# Patient Record
Sex: Male | Born: 1935 | Race: White | Hispanic: No | Marital: Single | Smoking: Former smoker
Health system: Southern US, Community
[De-identification: ages and names within clinical notes are randomized; demographics above are authoritative.]

## PROBLEM LIST (undated history)

## (undated) DIAGNOSIS — I519 Heart disease, unspecified: Secondary | ICD-10-CM

## (undated) DIAGNOSIS — N289 Disorder of kidney and ureter, unspecified: Secondary | ICD-10-CM

## (undated) DIAGNOSIS — J45909 Unspecified asthma, uncomplicated: Secondary | ICD-10-CM

## (undated) DIAGNOSIS — F0390 Unspecified dementia without behavioral disturbance: Secondary | ICD-10-CM

## (undated) DIAGNOSIS — F79 Unspecified intellectual disabilities: Secondary | ICD-10-CM

## (undated) DIAGNOSIS — I509 Heart failure, unspecified: Secondary | ICD-10-CM

## (undated) DIAGNOSIS — F419 Anxiety disorder, unspecified: Secondary | ICD-10-CM

## (undated) DIAGNOSIS — I219 Acute myocardial infarction, unspecified: Secondary | ICD-10-CM

## (undated) DIAGNOSIS — I10 Essential (primary) hypertension: Secondary | ICD-10-CM

## (undated) DIAGNOSIS — K219 Gastro-esophageal reflux disease without esophagitis: Secondary | ICD-10-CM

## (undated) DIAGNOSIS — I4891 Unspecified atrial fibrillation: Secondary | ICD-10-CM

## (undated) DIAGNOSIS — E785 Hyperlipidemia, unspecified: Secondary | ICD-10-CM

## (undated) DIAGNOSIS — E119 Type 2 diabetes mellitus without complications: Secondary | ICD-10-CM

## (undated) DIAGNOSIS — N400 Enlarged prostate without lower urinary tract symptoms: Secondary | ICD-10-CM

## (undated) HISTORY — DX: Hyperlipidemia, unspecified: E78.5

## (undated) HISTORY — DX: Benign prostatic hyperplasia without lower urinary tract symptoms: N40.0

## (undated) HISTORY — DX: Gastro-esophageal reflux disease without esophagitis: K21.9

## (undated) HISTORY — DX: Essential (primary) hypertension: I10

## (undated) HISTORY — DX: Heart disease, unspecified: I51.9

## (undated) HISTORY — DX: Acute myocardial infarction, unspecified: I21.9

## (undated) HISTORY — DX: Unspecified atrial fibrillation: I48.91

## (undated) HISTORY — DX: Unspecified asthma, uncomplicated: J45.909

## (undated) HISTORY — DX: Disorder of kidney and ureter, unspecified: N28.9

## (undated) HISTORY — DX: Anxiety disorder, unspecified: F41.9

---

## 1988-11-24 HISTORY — PX: HERNIA REPAIR: SHX51

## 2004-09-04 ENCOUNTER — Ambulatory Visit: Payer: Self-pay

## 2005-05-23 ENCOUNTER — Ambulatory Visit: Payer: Self-pay

## 2005-09-30 ENCOUNTER — Ambulatory Visit: Payer: Self-pay | Admitting: Family Medicine

## 2007-10-14 ENCOUNTER — Ambulatory Visit: Payer: Self-pay | Admitting: Gastroenterology

## 2007-10-21 ENCOUNTER — Emergency Department: Payer: Self-pay | Admitting: Emergency Medicine

## 2009-10-12 ENCOUNTER — Ambulatory Visit: Payer: Self-pay | Admitting: Internal Medicine

## 2011-10-13 ENCOUNTER — Inpatient Hospital Stay: Payer: Self-pay | Admitting: Internal Medicine

## 2013-03-22 ENCOUNTER — Ambulatory Visit: Payer: Self-pay | Admitting: Gastroenterology

## 2013-03-23 LAB — PATHOLOGY REPORT

## 2013-06-28 ENCOUNTER — Ambulatory Visit: Payer: Self-pay | Admitting: Specialist

## 2014-01-13 ENCOUNTER — Emergency Department: Payer: Self-pay | Admitting: Emergency Medicine

## 2014-01-13 LAB — URINALYSIS, COMPLETE
BLOOD: NEGATIVE
Bilirubin,UR: NEGATIVE
Glucose,UR: NEGATIVE mg/dL (ref 0–75)
Ketone: NEGATIVE
Leukocyte Esterase: NEGATIVE
Nitrite: NEGATIVE
Ph: 5 (ref 4.5–8.0)
Protein: NEGATIVE
Specific Gravity: 1.017 (ref 1.003–1.030)
WBC UR: 3 /HPF (ref 0–5)

## 2014-01-13 LAB — CBC
HCT: 41.7 % (ref 40.0–52.0)
HGB: 14.2 g/dL (ref 13.0–18.0)
MCH: 30.7 pg (ref 26.0–34.0)
MCHC: 34 g/dL (ref 32.0–36.0)
MCV: 90 fL (ref 80–100)
PLATELETS: 211 10*3/uL (ref 150–440)
RBC: 4.62 10*6/uL (ref 4.40–5.90)
RDW: 14.7 % — ABNORMAL HIGH (ref 11.5–14.5)
WBC: 11.4 10*3/uL — ABNORMAL HIGH (ref 3.8–10.6)

## 2014-01-13 LAB — COMPREHENSIVE METABOLIC PANEL
ALK PHOS: 96 U/L
Albumin: 3.9 g/dL (ref 3.4–5.0)
Anion Gap: 7 (ref 7–16)
BILIRUBIN TOTAL: 0.7 mg/dL (ref 0.2–1.0)
BUN: 23 mg/dL — AB (ref 7–18)
CHLORIDE: 102 mmol/L (ref 98–107)
Calcium, Total: 9.4 mg/dL (ref 8.5–10.1)
Co2: 26 mmol/L (ref 21–32)
Creatinine: 1.12 mg/dL (ref 0.60–1.30)
EGFR (African American): >60
EGFR (Non-African Amer.): >60
Glucose: 81 mg/dL (ref 65–99)
OSMOLALITY: 273 (ref 275–301)
POTASSIUM: 4.3 mmol/L (ref 3.5–5.1)
SGOT(AST): 38 U/L — ABNORMAL HIGH (ref 15–37)
SGPT (ALT): 25 U/L (ref 12–78)
Sodium: 135 mmol/L — ABNORMAL LOW (ref 136–145)
TOTAL PROTEIN: 8.1 g/dL (ref 6.4–8.2)

## 2014-01-13 LAB — TROPONIN I

## 2014-01-13 LAB — LIPASE, BLOOD: LIPASE: 111 U/L (ref 73–393)

## 2014-07-28 DIAGNOSIS — J449 Chronic obstructive pulmonary disease, unspecified: Secondary | ICD-10-CM | POA: Insufficient documentation

## 2015-03-18 NOTE — Consult Note (Signed)
PATIENT NAME:  Ralph Boyd, Ralph Boyd MR#:  [id number] DATE OF BIRTH:  1936-08-08  DATE OF CONSULTATION:  10/14/2011  REFERRING PHYSICIAN:  Dr. Thana Ates as well as [facility] with Dr. Camillo Flaming CONSULTING PHYSICIAN:  Dwayne D. Callwood, MD  INDICATION: Chest pain and angina.  HISTORY OF PRESENT ILLNESS: Ralph Boyd is a 79 year old mentally challenged male with history of hypertension, hyperlipidemia, diabetes, chronic obstructive pulmonary disease, former smoker, possible seizure disorder who is at a family care home and started having recurrent chest pain recently on multiple occasions. Was finally brought to the Emergency Room and subsequently was advised to be admitted for further evaluation and care. He was referred for a Myoview which was abnormal so cardiology consultation was then recommended for further evaluation and management.   PAST SURGICAL HISTORY:  1. Neck surgery. 2. Colonoscopy for polyps.   PAST MEDICAL HISTORY:  1. Hypertension. 2. Hyperlipidemia. 3. Diabetes. 4. Chronic obstructive pulmonary disease. 5. Former smoker. 6. Mental retardation.  7. Seizure disorder. 8. Behavioral disorder.  ALLERGIES: None.   MEDICATIONS:  1. Albuterol 4 mg 2 tablets twice a day. 2. Amitriptyline 25 mg at bedtime.  3. Eyedrops t.i.d.  4. Citalopram 20 mg a day.  5. Finasteride 5 mg daily. 6. HCTZ 12.5 in the morning. 7. Ativan 0.5 mg q.8 hours. 8. Metformin 25 mg a day.  9. Pravastatin 40 a day.  10. Quinapril 20 mg a day.  11. Zyprexa 5 mg a day.   FAMILY HISTORY: Reportedly was negative.   SOCIAL HISTORY: Smoker but quit three months ago. No illicit drug use. Resident of a family care home.  REVIEW OF SYSTEMS: Difficult to assess but he has had some chest pain, some shortness of breath. No blackout spells, syncope. No nausea or vomiting. No fever. No chills. No sweats. No weight loss or weight gain. No hemoptysis, hematemesis. No bright red blood per rectum.    PHYSICAL EXAMINATION:  VITAL SIGNS: Blood pressure 150/102, pulse 97, respiratory rate 20, afebrile.   HEENT: Normocephalic, atraumatic. Pupils reactive to light.   NECK: Supple. No jugular venous distention, bruits, adenopathy.   LUNGS: Clear to auscultation and percussion, mild significant rhonchi, no rales or wheezing.   HEART: Regular rate and rhythm. Positive S3. Soft S4. PMI slightly displaced laterally.   ABDOMEN: Benign.   EXTREMITIES: Within normal limits with slightly decreased pulses.   NEUROLOGIC: Intact.   SKIN: Normal.   LABORATORY, DIAGNOSTIC, AND RADIOLOGICAL DATA: BUN 24, otherwise chemistries are normal. Lipase 207, troponin 0.04. CBC normal. Chest x-ray cardiomegaly, otherwise normal. EKG normal sinus rhythm, left bundle branch block, otherwise nonspecific findings.   ASSESSMENT:  1. Angina.  2. Unstable angina.  3. Chest pain. 4. Cardiomyopathy. 5. Shortness of breath. 6. Diabetes. 7. Hypertension. 8. Hyperlipidemia.  9. Mental retardation.  10. History of seizures.   PLAN: Agree with admit, rule out for myocardial infarction. Agree with functional study, which turned out to be abnormal. Would then recommend cardiac catheterization as I am concerned his chest pain is probably coronary artery disease with an abnormal Myoview and abnormal EKG. Further evaluation I think is clearly indicated. Echocardiogram will be helpful. Will also consider medical therapy. Bypass surgery is probably out of the question because rehab would be next to impossible. Will probably treat the patient medically for now with nitrates, aspirin. Consider adding Plavix, beta blocker, ACE inhibitor provided his blood pressure tolerates and see if we can help with his anginal symptoms. We may consider angioplasty and stenting if he  does not tolerate medications and continues to have recurrent chest pain. Would base further evaluation on results.   ____________________________ Bobbie Stackwayne D.  Juliann Paresallwood, MD ddc:cms D: 10/15/2011 13:59:00 ET T: 10/15/2011 14:27:06 ET JOB#: [id number]  cc: Dwayne D. Juliann Paresallwood, MD, <Dictator> Alwyn PeaWAYNE D CALLWOOD MD ELECTRONICALLY SIGNED 11/28/2011 13:59

## 2015-03-22 ENCOUNTER — Ambulatory Visit
Admit: 2015-03-22 | Disposition: A | Discharge status: Home / Self Care | Payer: Self-pay | Attending: Gastroenterology | Admitting: Gastroenterology

## 2015-07-03 ENCOUNTER — Ambulatory Visit (INDEPENDENT_AMBULATORY_CARE_PROVIDER_SITE_OTHER): Payer: Medicare Other | Admitting: Urology

## 2015-07-03 VITALS — BP 150/68 | HR 68 | Ht 62.0 in | Wt 191.1 lb

## 2015-07-03 DIAGNOSIS — R350 Frequency of micturition: Secondary | ICD-10-CM | POA: Diagnosis not present

## 2015-07-03 LAB — URINALYSIS, COMPLETE
Bilirubin, UA: NEGATIVE
GLUCOSE, UA: NEGATIVE
Ketones, UA: NEGATIVE
NITRITE UA: NEGATIVE
PH UA: 7 (ref 5.0–7.5)
Protein, UA: NEGATIVE
RBC, UA: NEGATIVE
SPEC GRAV UA: 1.01 (ref 1.005–1.030)
Urobilinogen, Ur: 0.2 mg/dL (ref 0.2–1.0)

## 2015-07-03 LAB — MICROSCOPIC EXAMINATION
Bacteria, UA: NONE SEEN
Epithelial Cells (non renal): NONE SEEN /hpf (ref 0–10)
RBC MICROSCOPIC, UA: NONE SEEN /HPF (ref 0–?)
WBC UA: NONE SEEN /HPF (ref 0–?)

## 2015-07-03 LAB — BLADDER SCAN AMB NON-IMAGING: SCAN RESULT: 24

## 2015-07-03 MED ORDER — TAMSULOSIN HCL 0.4 MG PO CAPS: 0.4000 mg | ORAL_CAPSULE | Freq: Every day | ORAL | Status: DC

## 2015-07-03 NOTE — Progress Notes (Signed)
07/03/2015 3:09 PM   Quillian Quince [facility] 04-Jul-1936 [id number]  Referring provider: Jerl Mina, MD [address] [place], [place] [address]  Chief Complaint  Patient presents with  . Urinary Frequency    referred by Dr. Naaman Plummer  . Urinary Incontinence    urge    HPI: A 79 year old male who presents today referred by Dr. Jerl Mina for evaluation and management of worsening urinary incontinence progressive lower urinary tract symptoms. The patient has a history of BPH and overactive bladder for which she is been treated with myrbetriq 25 mg daily and finasteride 5 mg daily. His symptoms at the 3 stable until 6 months ago. At this point, the patient is now wearing depends diapers because of his incontinence. He goes through approximately 1 diaper per day. The patient is mentally disabled and as such is unable to participate in the interview. His caregiver does state that the patient does not have urinary frequency, voids AB 6 or 7 times in the day, and does not get up typically at night. The patient has no history of hematuria or dysuria. He has no history of urinary tract infections. He has no history of kidney stones. The patient does not drink a significant amount of fluid.     PMH: Past Medical History  Diagnosis Date  . Acid reflux   . Anxiety   . Asthma   . MI (myocardial infarction)   . Atrial fibrillation   . Heart disease   . HLD (hyperlipidemia)   . HTN (hypertension)   . Kidney disease   . BPH (benign prostatic hyperplasia)     Surgical History: No past surgical history on file.  Home Medications:    Medication List       This list is accurate as of: 07/03/15  3:09 PM.  Always use your most recent med list.               ADVAIR HFA 45-21 MCG/ACT inhaler  Generic drug:  fluticasone-salmeterol  INHALE 2 PUFFS BY MOUTH TWICE DAILY. **RINSE MOUTH AFTER EACH USE**     AMITIZA 24 MCG capsule  Generic drug:  lubiprostone  Take by  mouth.     amitriptyline 50 MG tablet  Commonly known as:  ELAVIL  TAKE 1/2 TABLET BY MOUTH AT BEDTIME.     amLODipine 5 MG tablet  Commonly known as:  NORVASC  TAKE 1 TABLET BY MOUTH TWICE DAILY.     aspirin 81 MG chewable tablet  CHEW AND SWALLOW ONE TABLET BY MOUTH ONCE DAILY.     citalopram 20 MG tablet  Commonly known as:  CELEXA  TAKE 1 TABLET BY MOUTH ONCE DAILY.     clopidogrel 75 MG tablet  Commonly known as:  PLAVIX  TAKE 1 TABLET BY MOUTH ONCE DAILY.     finasteride 5 MG tablet  Commonly known as:  PROSCAR  TAKE 1 TABLET BY MOUTH DAILY.     isosorbide mononitrate 60 MG 24 hr tablet  Commonly known as:  IMDUR  TAKE 1 TABLET BY MOUTH TWICE DAILY.     LORazepam 0.5 MG tablet  Commonly known as:  ATIVAN  Take by mouth.     metoprolol tartrate 25 MG tablet  Commonly known as:  LOPRESSOR  Take by mouth.     MINERIN Crea  APPLY 1 APPLICATION TO DRY SKIN DAILY.     MYRBETRIQ 25 MG Tb24 tablet  Generic drug:  mirabegron ER  TAKE 1 TABLET  BY MOUTH ONCE DAILY.     NEXIUM 40 MG capsule  Generic drug:  esomeprazole  TAKE 1 CAPSULE BY MOUTH DAILY.     nitroGLYCERIN 0.4 MG SL tablet  Commonly known as:  NITROSTAT  Place one tablet under tongue as needed.     OLANZapine 5 MG tablet  Commonly known as:  ZYPREXA  TAKE 1 TABLET BY MOUTH ONCE DAILY.     pravastatin 40 MG tablet  Commonly known as:  PRAVACHOL  TAKE 1 TABLET BY MOUTH AT BEDTIME.     PROAIR HFA 108 (90 BASE) MCG/ACT inhaler  Generic drug:  albuterol  Inhale into the lungs.     quinapril 20 MG tablet  Commonly known as:  ACCUPRIL  TAKE 1/2 TABLET (=10MG ) BY MOUTH EVERY MORNING.     tamsulosin 0.4 MG Caps capsule  Commonly known as:  FLOMAX  Take 1 capsule (0.4 mg total) by mouth daily.        Allergies: No Known Allergies  Family History: No family history on file.  Social History:  reports that he has quit smoking. He does not have any smokeless tobacco history on file. He reports  that he does not drink alcohol or use illicit drugs.  ROS: 12-point review of system was performed and is negative unless otherwise stated in the HPI with the following additions.  Physical Exam: BP 150/68 mmHg  Pulse 68  Ht  (1.575 m)  Wt 191 lb 1.6 oz (86.682 kg)  BMI 34.94 kg/m2  Constitutional:  Alert and oriented, No acute distress. HEENT: [place] AT, moist mucus membranes.  Trachea midline, no masses. Cardiovascular: No clubbing, cyanosis, or edema. Respiratory: Normal respiratory effort, no increased work of breathing. GI: Abdomen is soft, nontender, nondistended, no abdominal masses GU: No CVA tenderness. The patient's rectal exam demonstrates nodularity at the base, right greater than left. Prostate size is 30-40 g. Skin: No rashes, bruises or suspicious lesions. Lymph: No cervical or inguinal adenopathy. Neurologic: Grossly intact, no focal deficits, moving all 4 extremities. Psychiatric: Normal mood and affect.  Laboratory Data: Lab Results  Component Value Date   WBC 11.4* 01/13/2014   HGB 14.2 01/13/2014   HCT 41.7 01/13/2014   MCV 90 01/13/2014   PLT 211 01/13/2014    Lab Results  Component Value Date   CREATININE 1.12 01/13/2014    No results found for: PSA  No results found for: TESTOSTERONE  No results found for: HGBA1C  Urinalysis    Component Value Date/Time   COLORURINE Yellow 01/13/2014 1010   APPEARANCEUR Hazy 01/13/2014 1010   LABSPEC 1.017 01/13/2014 1010   PHURINE 5.0 01/13/2014 1010   GLUCOSEU Negative 01/13/2014 1010   HGBUR Negative 01/13/2014 1010   BILIRUBINUR Negative 01/13/2014 1010   KETONESUR Negative 01/13/2014 1010   PROTEINUR Negative 01/13/2014 1010   NITRITE Negative 01/13/2014 1010   LEUKOCYTESUR Negative 01/13/2014 1010    Pertinent Imaging: PVR: 23 mL  Assessment & Plan:  The patient has progressive urinary incontinence. The etiology of this remains unclear, his was void residual was low and his urinalysis today  clear. He does have a concerning gentle rectal exam. Our plan is to add Flomax to the patient's regimen for his BPH. I also increased his myrbetriq to 50 mg daily. I also sent the patient to the lab to have a PSA drawn today. The patient will follow-up in one month to reevaluate his progress. If his PSA is elevated, we will likely recheck at that point.   -  BLADDER SCAN AMB NON-IMAGING - Urinalysis, Complete - PSA - tamsulosin (FLOMAX) 0.4 MG CAPS capsule; Take 1 capsule (0.4 mg total) by mouth daily.  Dispense: 30 capsule; Refill: 0   Return in about 1 month (around 08/03/2015) for urinary frequency.  Crist Fat, MD  [facility] [address] [place], [place] [address] [phone]

## 2015-07-04 LAB — PSA: Prostate Specific Ag, Serum: 1.5 ng/mL (ref 0.0–4.0)

## 2015-07-13 DIAGNOSIS — F79 Unspecified intellectual disabilities: Secondary | ICD-10-CM | POA: Insufficient documentation

## 2015-07-27 ENCOUNTER — Encounter: Payer: Self-pay | Admitting: *Deleted

## 2015-07-27 DIAGNOSIS — I2510 Atherosclerotic heart disease of native coronary artery without angina pectoris: Secondary | ICD-10-CM | POA: Insufficient documentation

## 2015-07-27 DIAGNOSIS — E7800 Pure hypercholesterolemia, unspecified: Secondary | ICD-10-CM | POA: Insufficient documentation

## 2015-07-27 DIAGNOSIS — I10 Essential (primary) hypertension: Secondary | ICD-10-CM | POA: Insufficient documentation

## 2015-07-27 DIAGNOSIS — E119 Type 2 diabetes mellitus without complications: Secondary | ICD-10-CM | POA: Insufficient documentation

## 2015-07-31 ENCOUNTER — Ambulatory Visit (INDEPENDENT_AMBULATORY_CARE_PROVIDER_SITE_OTHER): Payer: Medicare Other | Admitting: Urology

## 2015-07-31 VITALS — BP 137/64 | HR 48 | Wt 187.5 lb

## 2015-07-31 DIAGNOSIS — R350 Frequency of micturition: Secondary | ICD-10-CM | POA: Diagnosis not present

## 2015-07-31 LAB — BLADDER SCAN AMB NON-IMAGING

## 2015-07-31 NOTE — Progress Notes (Signed)
07/31/2015 10:09 AM   Ralph Boyd 22-Feb-1936 [id number]  Referring provider: Jerl Mina, MD [address] [place], [place] [address]  Chief Complaint  Patient presents with  . Urinary Frequency    21month follow up    HPI:  1 - Lower Urinary Tract Symptoms / Incontinence without Awareness - long h/o what sounds like mix of irritative and obstructive LUTS. Severe mental disability and incontinent at times and unable to communicate when needs to void. In diapers at al times 2016 Cr 1.12, PVR 24 mL, PSA 1.5, UA without sig hematuria.  Currently on finasteride5  + myrbetriq 50+ tamsulosin 0.4 and caregivers report some incremental improvement, overall satisfied.    2 - Phimosis - impressive phimosis with inability to rectract foreskin noted on exam 2016. No balooning with voids and no problems with hygeine. No prior circumcision.   Today Ralph Boyd is seen in f/u above. Caaregivers report some incremental improvement in GU funciton. No hematuria.   PMH: Past Medical History  Diagnosis Date  . Acid reflux   . Anxiety   . Asthma   . MI (myocardial infarction)   . Atrial fibrillation   . Heart disease   . HLD (hyperlipidemia)   . HTN (hypertension)   . Kidney disease   . BPH (benign prostatic hyperplasia)     Surgical History: No past surgical history on file.  Home Medications:    Medication List       This list is accurate as of: 07/31/15 10:09 AM.  Always use your most recent med list.               ADVAIR HFA 45-21 MCG/ACT inhaler  Generic drug:  fluticasone-salmeterol  INHALE 2 PUFFS BY MOUTH TWICE DAILY. **RINSE MOUTH AFTER EACH USE**     AMITIZA 24 MCG capsule  Generic drug:  lubiprostone  Take by mouth.     amitriptyline 50 MG tablet  Commonly known as:  ELAVIL  TAKE 1/2 TABLET BY MOUTH AT BEDTIME.     amLODipine 5 MG tablet  Commonly known as:  NORVASC  TAKE 1 TABLET BY MOUTH TWICE DAILY.     aspirin 81 MG chewable tablet  CHEW  AND SWALLOW ONE TABLET BY MOUTH ONCE DAILY.     citalopram 20 MG tablet  Commonly known as:  CELEXA  TAKE 1 TABLET BY MOUTH ONCE DAILY.     clopidogrel 75 MG tablet  Commonly known as:  PLAVIX  TAKE 1 TABLET BY MOUTH ONCE DAILY.     finasteride 5 MG tablet  Commonly known as:  PROSCAR  TAKE 1 TABLET BY MOUTH DAILY.     isosorbide mononitrate 60 MG 24 hr tablet  Commonly known as:  IMDUR  TAKE 1 TABLET BY MOUTH TWICE DAILY.     LORazepam 0.5 MG tablet  Commonly known as:  ATIVAN  Take by mouth.     metoprolol succinate 25 MG 24 hr tablet  Commonly known as:  TOPROL-XL  Take 25 mg by mouth daily.     metoprolol tartrate 25 MG tablet  Commonly known as:  LOPRESSOR  Take by mouth.     MINERIN Crea  APPLY 1 APPLICATION TO DRY SKIN DAILY.     MYRBETRIQ 50 MG Tb24 tablet  Generic drug:  mirabegron ER  Take 50 mg by mouth daily.     NEXIUM 40 MG capsule  Generic drug:  esomeprazole  TAKE 1 CAPSULE BY MOUTH DAILY.     nitroGLYCERIN  0.4 MG SL tablet  Commonly known as:  NITROSTAT  Place one tablet under tongue as needed.     pravastatin 40 MG tablet  Commonly known as:  PRAVACHOL  TAKE 1 TABLET BY MOUTH AT BEDTIME.     PROAIR HFA 108 (90 BASE) MCG/ACT inhaler  Generic drug:  albuterol  Inhale into the lungs.     quinapril 20 MG tablet  Commonly known as:  ACCUPRIL  TAKE 1/2 TABLET (=10MG ) BY MOUTH EVERY MORNING.     tamsulosin 0.4 MG Caps capsule  Commonly known as:  FLOMAX  Take 1 capsule (0.4 mg total) by mouth daily.        Allergies: No Known Allergies  Family History: No family history on file.  Social History:  reports that he has quit smoking. He does not have any smokeless tobacco history on file. He reports that he does not drink alcohol or use illicit drugs.  ROS:   Urological Symptom Review   Review of Systems  Gastrointestinal (upper)  : Negative for upper GI symptoms  Gastrointestinal (lower) : Negative for lower GI  symptoms  Constitutional : Negative for symptoms  Skin: Negative for skin symptoms  Eyes: Negative for eye symptoms  Ear/Nose/Throat : Negative for Ear/Nose/Throat symptoms  Hematologic/Lymphatic: Negative for Hematologic/Lymphatic symptoms  Cardiovascular : Negative for cardiovascular symptoms  Respiratory : Negative for respiratory symptoms  Endocrine: Negative for endocrine symptoms  Musculoskeletal: Negative for musculoskeletal symptoms  Neurological: Negative for neurological symptoms  Psychologic: Negative for psychiatric symptoms At basleie level of cognitive funtion per caregivers.                                      Physical Exam: BP 137/64 mmHg  Pulse 48  Wt 187 lb 8 oz (85.049 kg)  Constitutional:  Alert and oriented, No acute distress. HEENT: Ralph Boyd AT, moist mucus membranes.  Trachea midline, no masses. Cardiovascular: No clubbing, cyanosis, or edema. Respiratory: Normal respiratory effort, no increased work of breathing. GI: Abdomen is soft, nontender, nondistended, no abdominal masses GU: No CVA tenderness. Mod-severe phimosis, unable to visualize glans, no discharge / erythema. Skin: No rashes, bruises or suspicious lesions. Lymph: No cervical or inguinal adenopathy. Neurologic: Grossly intact, no focal deficits, moving all 4 extremities. Psychiatric: Blunted affect. Cooperative but AO x 0.   Laboratory Data: Lab Results  Component Value Date   WBC 11.4* 01/13/2014   HGB 14.2 01/13/2014   HCT 41.7 01/13/2014   MCV 90 01/13/2014   PLT 211 01/13/2014    Lab Results  Component Value Date   CREATININE 1.12 01/13/2014    Lab Results  Component Value Date   PSA 1.5 07/03/2015    No results found for: TESTOSTERONE  No results found for: HGBA1C  Urinalysis    Component Value Date/Time   COLORURINE Yellow 01/13/2014 1010   APPEARANCEUR Hazy 01/13/2014 1010   LABSPEC 1.017 01/13/2014 1010   PHURINE 5.0  01/13/2014 1010   GLUCOSEU Negative 07/03/2015 1411   GLUCOSEU Negative 01/13/2014 1010   HGBUR Negative 01/13/2014 1010   BILIRUBINUR Negative 07/03/2015 1411   BILIRUBINUR Negative 01/13/2014 1010   KETONESUR Negative 01/13/2014 1010   PROTEINUR Negative 01/13/2014 1010   NITRITE Negative 07/03/2015 1411   NITRITE Negative 01/13/2014 1010   LEUKOCYTESUR Trace* 07/03/2015 1411   LEUKOCYTESUR Negative 01/13/2014 1010    Pertinent Imaging:   Assessment & Plan:  1 - Lower Urinary Tract Symptoms / Incontinence without Awareness - on maximal medical therapy for presumed BPH and OAB compnents, he is not in retention. Discussed goals of management with caregiveer and they understand that soe element is clearly driven by his inability to communicate. I feel there is no role for additional GU meds, but do think continuing current regimen reaosnable as his caregiveers state he is improved on therapy.   2 - Phimosis - no funcitonal sequalae, does make voided  urine samples completely unreliable. Observe.  3 - RTC 1 year with NP for med refill, exam.    No Follow-up on file.  Sebastian Ache, MD  [facility] [address], [address] [place], [place] [address] [phone]

## 2015-08-03 ENCOUNTER — Other Ambulatory Visit: Payer: Self-pay | Admitting: Family Medicine

## 2015-08-03 DIAGNOSIS — R9389 Abnormal findings on diagnostic imaging of other specified body structures: Secondary | ICD-10-CM

## 2015-08-09 ENCOUNTER — Ambulatory Visit
Admission: RE | Admit: 2015-08-09 | Discharge: 2015-08-09 | Disposition: A | Discharge status: Home / Self Care | Payer: Medicare Other | Source: Ambulatory Visit | Attending: Family Medicine | Admitting: Family Medicine

## 2015-08-09 DIAGNOSIS — R938 Abnormal findings on diagnostic imaging of other specified body structures: Secondary | ICD-10-CM | POA: Diagnosis present

## 2015-08-09 DIAGNOSIS — I2510 Atherosclerotic heart disease of native coronary artery without angina pectoris: Secondary | ICD-10-CM | POA: Insufficient documentation

## 2015-08-09 DIAGNOSIS — K449 Diaphragmatic hernia without obstruction or gangrene: Secondary | ICD-10-CM | POA: Diagnosis not present

## 2015-08-09 DIAGNOSIS — R9389 Abnormal findings on diagnostic imaging of other specified body structures: Secondary | ICD-10-CM

## 2015-08-09 MED ORDER — IOHEXOL 300 MG/ML  SOLN
75.0000 mL | Freq: Once | INTRAMUSCULAR | Status: AC | PRN
  Administered 2015-08-09: 75 mL via INTRAVENOUS

## 2016-04-16 ENCOUNTER — Ambulatory Visit (INDEPENDENT_AMBULATORY_CARE_PROVIDER_SITE_OTHER): Payer: Medicare Other | Admitting: Urology

## 2016-04-16 ENCOUNTER — Encounter: Payer: Self-pay | Admitting: Urology

## 2016-04-16 VITALS — BP 105/66 | HR 57 | Ht 62.0 in | Wt 181.3 lb

## 2016-04-16 DIAGNOSIS — R319 Hematuria, unspecified: Secondary | ICD-10-CM | POA: Diagnosis not present

## 2016-04-16 NOTE — Progress Notes (Signed)
04/16/2016 8:44 AM   Ralph Boyd [birth year] [id number]  Referring provider: Jerl Mina, MD [address] [facility] [place], [place] [address]  Chief Complaint  Patient presents with  . Hematuria    patient unsure saw blood in underwear    HPI: Patient is a 80 year old Caucasian male who presents with his sister, Ralph Boyd, for further evaluation after his caregivers stated they saw blood in his underpants.  Patient himself is a poor historian, but he states he has not seen blood in his urine.  His sister states that he had bright red blood in his underpants sometime last week and another incident in January 2017.  He is having frequency of urination, getting up at night to urinate and leakage of urine. He does not complain of dysuria, suprapubic pain, back pain, abdominal pain or flank pain.  He does not have a prior history of nephrolithiasis.  He has not had recent fevers, chills, nausea or vomiting.  He and his sister deny any blood in the stool.    His UA was unremarkable.    PMH: Past Medical History  Diagnosis Date  . Acid reflux   . Anxiety   . Asthma   . MI (myocardial infarction) (HCC)   . Atrial fibrillation (HCC)   . Heart disease   . HLD (hyperlipidemia)   . HTN (hypertension)   . Kidney disease   . BPH (benign prostatic hyperplasia)     Surgical History: Past Surgical History  Procedure Laterality Date  . Hernia repair      Home Medications:    Medication List       This list is accurate as of: 04/16/16 11:59 PM.  Always use your most recent med list.               ADVAIR HFA 45-21 MCG/ACT inhaler  Generic drug:  fluticasone-salmeterol  INHALE 2 PUFFS BY MOUTH TWICE DAILY. **RINSE MOUTH AFTER EACH USE**     AMITIZA 24 MCG capsule  Generic drug:  lubiprostone  Take by mouth. Reported on 04/16/2016     amitriptyline 50 MG tablet  Commonly known as:  ELAVIL  TAKE 1/2 TABLET BY MOUTH AT BEDTIME.     amLODipine 5 MG  tablet  Commonly known as:  NORVASC  TAKE 1 TABLET BY MOUTH TWICE DAILY.     aspirin 81 MG chewable tablet  CHEW AND SWALLOW ONE TABLET BY MOUTH ONCE DAILY.     citalopram 20 MG tablet  Commonly known as:  CELEXA  TAKE 1 TABLET BY MOUTH ONCE DAILY.     clopidogrel 75 MG tablet  Commonly known as:  PLAVIX  TAKE 1 TABLET BY MOUTH ONCE DAILY.     finasteride 5 MG tablet  Commonly known as:  PROSCAR  TAKE 1 TABLET BY MOUTH DAILY.     furosemide 20 MG tablet  Commonly known as:  LASIX  Take by mouth. Reported on 04/16/2016     GAVISCON 95-358 MG/15ML Susp  Generic drug:  aluminum hydroxide-magnesium carbonate  Take by mouth.     isosorbide mononitrate 60 MG 24 hr tablet  Commonly known as:  IMDUR  TAKE 1 TABLET BY MOUTH TWICE DAILY.     lisinopril 5 MG tablet  Commonly known as:  PRINIVIL,ZESTRIL  Take by mouth.     loperamide 2 MG tablet  Commonly known as:  IMODIUM A-D  Take by mouth.     LORazepam 0.5 MG tablet  Commonly known  as:  ATIVAN  Take by mouth. Reported on 04/16/2016     METAMUCIL 0.52 g capsule  Generic drug:  psyllium  Take by mouth. Reported on 04/16/2016     metoprolol succinate 25 MG 24 hr tablet  Commonly known as:  TOPROL-XL  Take 25 mg by mouth daily. Reported on 04/16/2016     metoprolol tartrate 25 MG tablet  Commonly known as:  LOPRESSOR  Take by mouth. Reported on 04/16/2016     MINERIN Crea  Reported on 04/16/2016     MYRBETRIQ 50 MG Tb24 tablet  Generic drug:  mirabegron ER  Take 50 mg by mouth daily. Reported on 04/16/2016     NEXIUM 40 MG capsule  Generic drug:  esomeprazole  TAKE 1 CAPSULE BY MOUTH DAILY.     nitroGLYCERIN 0.4 MG SL tablet  Commonly known as:  NITROSTAT  Place one tablet under tongue as needed.     permethrin 5 % cream  Commonly known as:  ELIMITE  Reported on 04/16/2016     pravastatin 40 MG tablet  Commonly known as:  PRAVACHOL  Reported on 04/16/2016     PROAIR HFA 108 (90 Base) MCG/ACT inhaler  Generic  drug:  albuterol  Inhale into the lungs.     quinapril 20 MG tablet  Commonly known as:  ACCUPRIL  Reported on 04/16/2016     tamsulosin 0.4 MG Caps capsule  Commonly known as:  FLOMAX  Take 1 capsule (0.4 mg total) by mouth daily.        Allergies: No Known Allergies  Family History: No family history on file.  Social History:  reports that he has quit smoking. He does not have any smokeless tobacco history on file. He reports that he does not drink alcohol or use illicit drugs.  ROS: UROLOGY Frequent Urination?: Yes Hard to postpone urination?: No Burning/pain with urination?: No Get up at night to urinate?: Yes Leakage of urine?: Yes Urine stream starts and stops?: No Trouble starting stream?: No Do you have to strain to urinate?: No Blood in urine?: No Urinary tract infection?: No Sexually transmitted disease?: No Injury to kidneys or bladder?: No Painful intercourse?: No Weak stream?: No Erection problems?: No Penile pain?: No  Gastrointestinal Nausea?: No Vomiting?: No Indigestion/heartburn?: No Diarrhea?: No Constipation?: No  Constitutional Fever: No Night sweats?: No Weight loss?: No Fatigue?: No  Skin Skin rash/lesions?: No Itching?: No  Eyes Blurred vision?: No Double vision?: No  Ears/Nose/Throat Sore throat?: No Sinus problems?: No  Hematologic/Lymphatic Swollen glands?: No Easy bruising?: No  Cardiovascular Leg swelling?: No Chest pain?: No  Respiratory Cough?: No Shortness of breath?: Yes  Endocrine Excessive thirst?: Yes  Musculoskeletal Back pain?: No Joint pain?: No  Neurological Headaches?: No Dizziness?: No  Psychologic Depression?: No Anxiety?: No  Physical Exam: BP 105/66 mmHg  Pulse 57  Ht  (1.575 m)  Wt 181 lb 4.8 oz (82.237 kg)  BMI 33.15 kg/m2  Constitutional: Well nourished. Alert and oriented, No acute distress. HEENT: Ralph Boyd AT, moist mucus membranes. Trachea midline, no  masses. Cardiovascular: No clubbing, cyanosis, or edema. Respiratory: Normal respiratory effort, no increased work of breathing. GI: Abdomen is soft, non tender, non distended, no abdominal masses. Liver and spleen not palpable.  No hernias appreciated.  Stool sample for occult testing is not indicated.   GU: No CVA tenderness.  No bladder fullness or masses.  Patient with uncircumcised phallus. Foreskin could not be retracted  Urethral meatus is patent.  No  penile discharge. No penile lesions or rashes. Scrotum without lesions, cysts, rashes and/or edema.  Testicles are located scrotally bilaterally. No masses are appreciated in the testicles. Left and right epididymis are normal. Rectal: Patient with  normal sphincter tone. Anus and perineum without scarring or rashes. No rectal masses are appreciated. Prostate is approximately 50 grams, no nodules are appreciated. Seminal vesicles are normal. Skin: No rashes, bruises or suspicious lesions. Lymph: No cervical or inguinal adenopathy. Neurologic: Grossly intact, no focal deficits, moving all 4 extremities. Psychiatric: Normal mood and affect.  Laboratory Data: Lab Results  Component Value Date   WBC 11.4* 01/13/2014   HGB 14.2 01/13/2014   HCT 41.7 01/13/2014   MCV 90 01/13/2014   PLT 211 01/13/2014    Lab Results  Component Value Date   CREATININE 1.12 01/13/2014    Lab Results  Component Value Date   AST 38* 01/13/2014   Lab Results  Component Value Date   ALT 25 01/13/2014   PSA history  1.5 ng/mL on 07/03/2015  Urinalysis Results for orders placed or performed in visit on 04/16/16  Microscopic Examination  Result Value Ref Range   WBC, UA 6-10 (A) 0 -  5 /hpf   RBC, UA 0-2 0 -  2 /hpf   Epithelial Cells (non renal) 0-10 0 - 10 /hpf   Bacteria, UA Few None seen/Few  Urinalysis, Complete  Result Value Ref Range   Specific Gravity, UA 1.015 1.005 - 1.030   pH, UA 6.0 5.0 - 7.5   Color, UA Yellow Yellow   Appearance  Ur Cloudy (A) Clear   Leukocytes, UA 2+ (A) Negative   Protein, UA Negative Negative/Trace   Glucose, UA Negative Negative   Ketones, UA Negative Negative   RBC, UA Negative Negative   Bilirubin, UA Negative Negative   Urobilinogen, Ur 0.2 0.2 - 1.0 mg/dL   Nitrite, UA Negative Negative   Microscopic Examination See below:     Assessment & Plan:    1. Gross hematuria:   No sign of bleeding on today's exam. His urinalysis was negative for hematuria.  There had not be a witnessed event of gross hematuria in the toilet.  His sister states that sometimes he does scratch his groin area at night.  She does not want the patient to undergo any invasive testing at this time.  Explained to patient the causes of blood in the urine are as follows: stones,  BPH, UTI's, damage to the urinary tract and/or cancer.  She will continue to monitor Mr. Kathrine HaddockWeatherford.  If he should have microscopic hematuria or a witnessed event of gross hematuria, she will contact the office.  - Urinalysis, Complete   Return if symptoms worsen or fail to improve.  These notes generated with voice recognition software. I apologize for typographical errors.  Michiel CowboySHANNON Gracemarie Skeet, PA-C  [facility] [address], [address] [place], [place] [address] [phone]

## 2016-04-17 LAB — URINALYSIS, COMPLETE
Bilirubin, UA: NEGATIVE
GLUCOSE, UA: NEGATIVE
Ketones, UA: NEGATIVE
Nitrite, UA: NEGATIVE
PROTEIN UA: NEGATIVE
RBC, UA: NEGATIVE
Specific Gravity, UA: 1.015 (ref 1.005–1.030)
Urobilinogen, Ur: 0.2 mg/dL (ref 0.2–1.0)
pH, UA: 6 (ref 5.0–7.5)

## 2016-04-17 LAB — MICROSCOPIC EXAMINATION

## 2016-08-06 ENCOUNTER — Ambulatory Visit: Payer: Medicare Other | Admitting: Urology

## 2016-08-15 ENCOUNTER — Other Ambulatory Visit: Payer: Self-pay | Admitting: Specialist

## 2016-08-15 DIAGNOSIS — J8410 Pulmonary fibrosis, unspecified: Secondary | ICD-10-CM

## 2016-08-15 DIAGNOSIS — R0602 Shortness of breath: Secondary | ICD-10-CM

## 2016-08-20 ENCOUNTER — Ambulatory Visit
Admission: RE | Admit: 2016-08-20 | Discharge: 2016-08-20 | Disposition: A | Discharge status: Home / Self Care | Payer: Medicare Other | Source: Ambulatory Visit | Attending: Specialist | Admitting: Specialist

## 2016-08-20 DIAGNOSIS — I517 Cardiomegaly: Secondary | ICD-10-CM | POA: Insufficient documentation

## 2016-08-20 DIAGNOSIS — K449 Diaphragmatic hernia without obstruction or gangrene: Secondary | ICD-10-CM | POA: Diagnosis not present

## 2016-08-20 DIAGNOSIS — J439 Emphysema, unspecified: Secondary | ICD-10-CM | POA: Diagnosis not present

## 2016-08-20 DIAGNOSIS — I700 Atherosclerosis of aorta: Secondary | ICD-10-CM | POA: Insufficient documentation

## 2016-08-20 DIAGNOSIS — R918 Other nonspecific abnormal finding of lung field: Secondary | ICD-10-CM | POA: Diagnosis not present

## 2016-08-20 DIAGNOSIS — R0602 Shortness of breath: Secondary | ICD-10-CM | POA: Insufficient documentation

## 2016-08-20 DIAGNOSIS — J8410 Pulmonary fibrosis, unspecified: Secondary | ICD-10-CM

## 2016-08-20 DIAGNOSIS — I2510 Atherosclerotic heart disease of native coronary artery without angina pectoris: Secondary | ICD-10-CM | POA: Insufficient documentation

## 2016-08-21 ENCOUNTER — Other Ambulatory Visit
Admission: RE | Admit: 2016-08-21 | Discharge: 2016-08-21 | Disposition: A | Discharge status: Home / Self Care | Payer: Medicare Other | Source: Ambulatory Visit | Attending: Internal Medicine | Admitting: Internal Medicine

## 2016-08-21 DIAGNOSIS — I5021 Acute systolic (congestive) heart failure: Secondary | ICD-10-CM | POA: Diagnosis present

## 2016-08-21 LAB — BRAIN NATRIURETIC PEPTIDE: B Natriuretic Peptide: 162 pg/mL — ABNORMAL HIGH (ref 0.0–100.0)

## 2016-09-04 ENCOUNTER — Other Ambulatory Visit: Payer: Self-pay | Admitting: Specialist

## 2016-09-04 DIAGNOSIS — R1310 Dysphagia, unspecified: Secondary | ICD-10-CM

## 2016-09-23 ENCOUNTER — Ambulatory Visit
Admission: RE | Admit: 2016-09-23 | Discharge: 2016-09-23 | Disposition: A | Discharge status: Home / Self Care | Payer: Medicare Other | Source: Ambulatory Visit | Attending: Specialist | Admitting: Specialist

## 2016-09-23 DIAGNOSIS — E119 Type 2 diabetes mellitus without complications: Secondary | ICD-10-CM | POA: Insufficient documentation

## 2016-09-23 DIAGNOSIS — F79 Unspecified intellectual disabilities: Secondary | ICD-10-CM | POA: Diagnosis not present

## 2016-09-23 DIAGNOSIS — R1310 Dysphagia, unspecified: Secondary | ICD-10-CM

## 2016-09-23 DIAGNOSIS — I2510 Atherosclerotic heart disease of native coronary artery without angina pectoris: Secondary | ICD-10-CM | POA: Insufficient documentation

## 2016-09-23 DIAGNOSIS — J986 Disorders of diaphragm: Secondary | ICD-10-CM | POA: Diagnosis not present

## 2016-09-23 DIAGNOSIS — I10 Essential (primary) hypertension: Secondary | ICD-10-CM | POA: Diagnosis not present

## 2016-09-23 DIAGNOSIS — J449 Chronic obstructive pulmonary disease, unspecified: Secondary | ICD-10-CM | POA: Insufficient documentation

## 2016-09-23 NOTE — Therapy (Addendum)
[place] [facility] [address] [place], [place], [address] Phone: [phone]   Fax:     Modified Barium Swallow  Patient Details  Name: Ralph Boyd MRN: [id number] Date of Birth: 07/22/1936 No Data Recorded  Encounter Date: 09/23/2016   Subjective: Patient behavior: (alertness, ability to follow instructions, etc.): pt presents for a swallow study referred by the Pulmonologist. 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 Pt has an extensive medical history including Mental Retardation, DM2, HTN.  Pt denied any c/o difficulty swallowing; no weight loss reported. Pt stated he eats "everything" and denied any coughing or choking. No reports of recent pneumonia. Dentition+. Chief complaint: dysphagia   Objective:  Radiological Procedure: A videoflouroscopic evaluation of oral-preparatory, reflex initiation, and pharyngeal phases of the swallow was performed; as well as a screening of the upper esophageal phase.  I. POSTURE: upright II. VIEW: lateral  III. COMPENSATORY STRATEGIES: none indicated  IV. BOLUSES ADMINISTERED:   Thin Liquid: 5 trials  Nectar-thick Liquid: 1 trial  Honey-thick Liquid: NT  Puree: 3 trials  Mechanical Soft: 1 trial V. RESULTS OF EVALUATION: A. ORAL PREPARATORY PHASE: (The lips, tongue, and velum are observed for strength and coordination)       **Overall Severity Rating: WFL. Adequate oral control of boluses; timely A-P transfer (mildly quick) w/ appropriate oral clearing post trials.  B. SWALLOW INITIATION/REFLEX: (The reflex is normal if "triggered" by the time the bolus reached the base of the tongue)  **Overall Severity Rating: [facility]. Timely pharyngeal swallow initiation w/ airway closure noted.  C. PHARYNGEAL PHASE: (Pharyngeal function is normal if the bolus shows rapid, smooth, and continuous transit through the  pharynx and there is no pharyngeal residue after the swallow)  **Overall Severity Rating: [facility]. Adequate pharyngeal pressure and laryngeal excursion during the swallowing; no pharyngeal residue noted post swallowing.   D. LARYNGEAL PENETRATION: (Material entering into the laryngeal inlet/vestibule but not aspirated): NONE E. ASPIRATION: NONE F. ESOPHAGEAL PHASE: (Screening of the upper esophagus): appeared [facility] w/ no dysmotility noted in the cervical esophagus. Unable to assess mid-Esophagus w/ this study. There is some discussion re: patulous esophagus per report from the Pulmonologist which could result in bolus dysmotility and possible Reflux w/ retrograde activity.   ASSESSMENT: Pt appeared to present w/ adequate oropharyngeal phases of swallowing during this study today. No laryngeal penetration or aspiration occurred during this study. Pt assisted in feeding self but was noted to be slightly impulsive requiring gentle verbal cues to slow down and take his time w/ po trials. Oral phase c/b adequate bolus control and clearing. Timely pharyngeal swallow initiation was noted w/ adequate pharyngeal pressure and laryngeal excursion - no bolus residue remained in the pharynx. During the Esophageal phase, adequate bolus motility was noted through the cervical esophagus, however, there is discussion regarding pt having a patulous esophagus(per chart note) which could result in bolus dysmotility and possible Reflux w/ retrograde activity. This presentation could potentially increase risk for aspiration of Reflux which could then impact Pulmonary status. Recommend f/u w/ GI for further esophageal motility assessment; Reflux tx and education as indicated.   PLAN/RECOMMENDATIONS:  A. Diet: Mech Soft-regular(meats cut and moistened); Thin liquids  B. Swallowing Precautions: general aspiration precautions to include slowing down and chewing foods well; clear mouth b/t bites and sips  C. Recommended consultation  to: GI for Esophageal dysmotility; Reflux tx/education as indicated  D. Therapy recommendations: none  at this time  E. Results and recommendations were discussed w/ pt; video reviewed. Unsure of pt's level of comprehension. Family per present to f/u w/ further education but did not contact this SLP today.         End of Session - 09/23/16 1511    Visit Number 1   Number of Visits 1   Date for SLP Re-Evaluation 09/23/16   SLP Start Time 1245   SLP Stop Time  1345   SLP Time Calculation (min) 60 min   Activity Tolerance Patient tolerated treatment well      Past Medical History:  Diagnosis Date  . Acid reflux   . Anxiety   . Asthma   . Atrial fibrillation (HCC)   . BPH (benign prostatic hyperplasia)   . Heart disease   . HLD (hyperlipidemia)   . HTN (hypertension)   . Kidney disease   . MI (myocardial infarction)     Past Surgical History:  Procedure Laterality Date  . HERNIA REPAIR      There were no vitals filed for this visit.              Dysphagia, unspecified type - Plan: DG OP Swallowing Func-Medicare/Speech Path, DG OP Swallowing Func-Medicare/Speech Path      G-Codes - 09/23/16 1512    Functional Assessment Tool Used clinical judgement   Functional Limitations Swallowing   Swallow Current Status ([id number]) At least 1 percent but less than 20 percent impaired, limited or restricted   Swallow Goal Status ([id number]([id number]) At least 1 percent but less than 20 percent impaired, limited or restricted   Swallow Discharge Status [phone]([id number]) At least 1 percent but less than 20 percent impaired, limited or restricted          Problem List Patient Active Problem List   Diagnosis Date Noted  . Arteriosclerosis of coronary artery 07/27/2015  . Diabetes mellitus, type 2 (HCC) 07/27/2015  . Hypercholesteremia 07/27/2015  . BP (high blood pressure) 07/27/2015  . Intellectual disability 07/13/2015  . COPD, moderate (HCC) 07/28/2014      Jerilynn SomKatherine Watson, MS,  CCC-SLP Watson,Katherine 09/23/2016, 3:12 PM  [place] [facility] [address] [place], [place], [address] Phone: [phone]   Fax:     Name: Ralph Boyd MRN: [id number] Date of Birth: 03/06/1936

## 2017-07-14 ENCOUNTER — Other Ambulatory Visit
Admission: RE | Admit: 2017-07-14 | Discharge: 2017-07-14 | Disposition: A | Discharge status: Home / Self Care | Payer: Medicare Other | Source: Ambulatory Visit | Attending: Internal Medicine | Admitting: Internal Medicine

## 2017-07-14 DIAGNOSIS — R0602 Shortness of breath: Secondary | ICD-10-CM | POA: Insufficient documentation

## 2017-07-14 DIAGNOSIS — I509 Heart failure, unspecified: Secondary | ICD-10-CM | POA: Insufficient documentation

## 2017-07-14 LAB — BRAIN NATRIURETIC PEPTIDE: B NATRIURETIC PEPTIDE 5: 160 pg/mL — AB (ref 0.0–100.0)

## 2017-12-08 IMAGING — RF DG SWALLOWING FUNCTION
12 of 13 series · 13 of 24 positions shown · non-contrast
Comparison: None.

CLINICAL DATA: Dysphagia

EXAM:
MODIFIED BARIUM SWALLOW
TECHNIQUE: Different consistencies of barium were administered orally to the
patient by the Speech Pathologist. Imaging of the pharynx was
performed in the lateral projection.
FLUOROSCOPY TIME:  Fluoroscopy Time:  36 seconds
Radiation Exposure Index (if provided by the fluoroscopic device):
0.8 mGy
Number of Acquired Spot Images: 0

[Series 1: run · 1 of 17 frames shown (1 of 12)]
[frame 3/17]
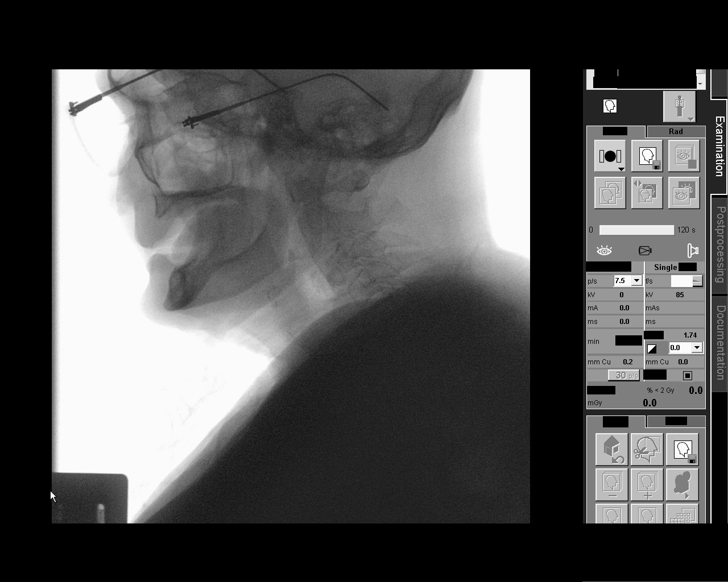

[Series 2: run · 1 of 17 frames shown (2 of 12)]
[frame 9/17]
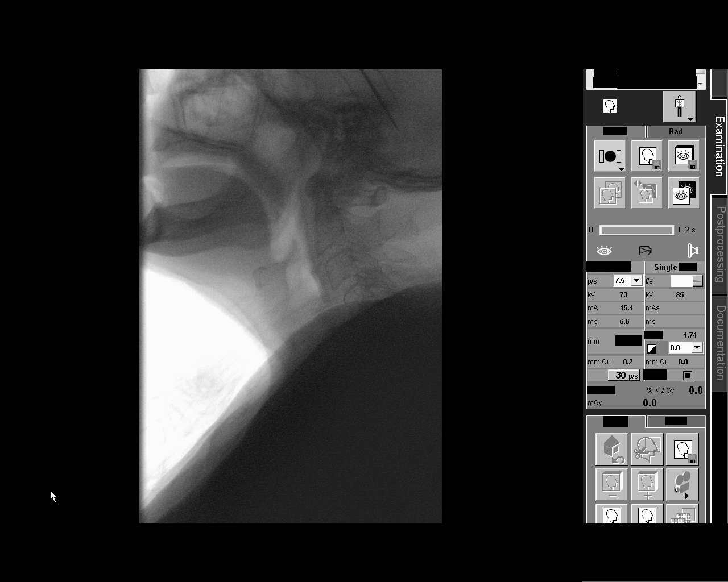

[Series 3: run · 1 of 89 frames shown (3 of 12)]
[frame 45/89]
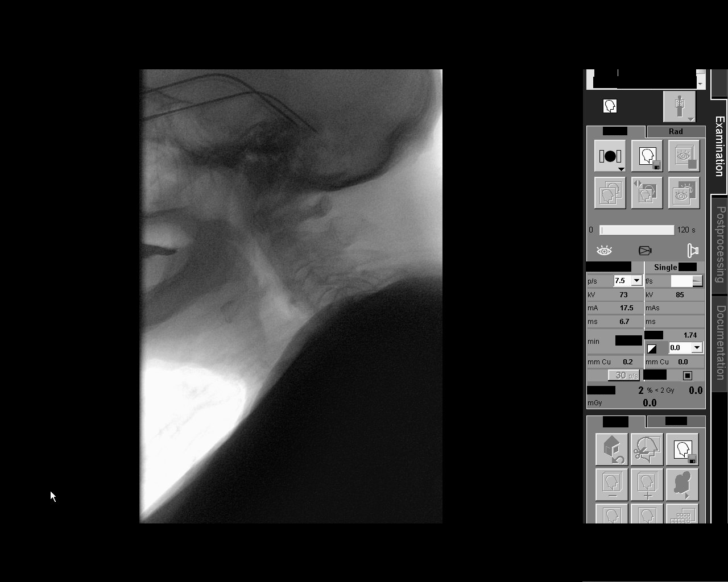

[Series 4: run · 1 of 74 frames shown (4 of 12)]
[frame 38/74]
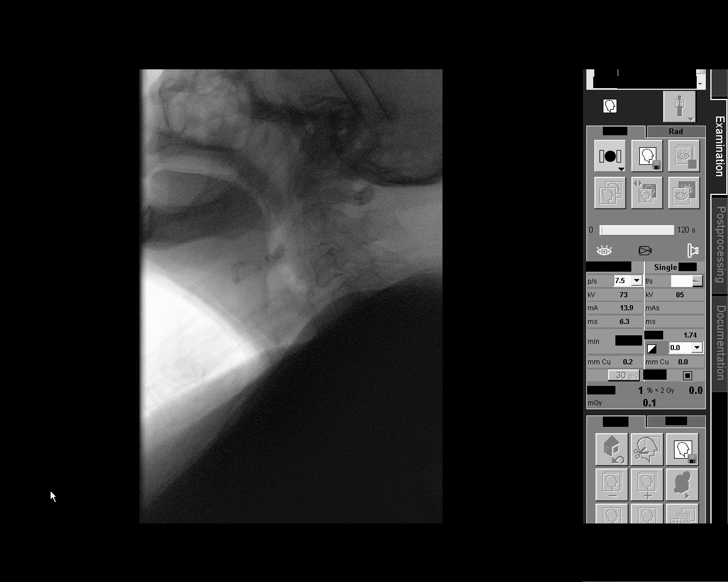

[Series 5: run · 1 of 101 frames shown (5 of 12)]
[frame 51/101]
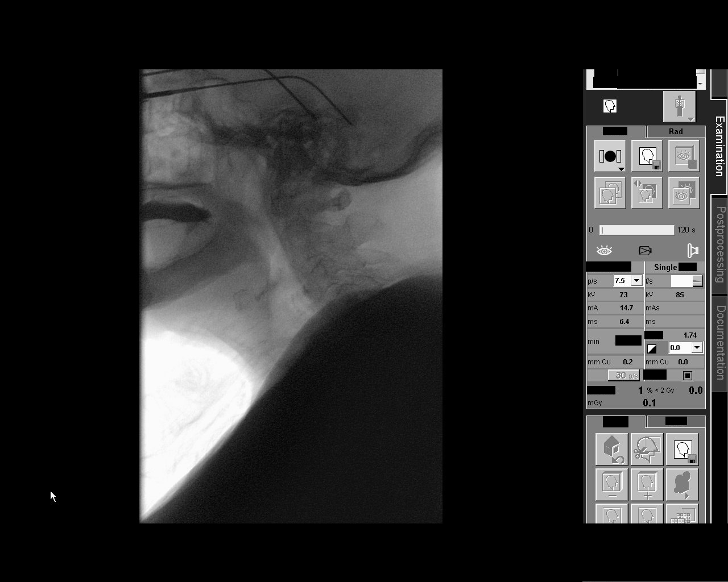

[Series 6: run · 1 of 19 frames shown (6 of 12)]
[frame 17/19]
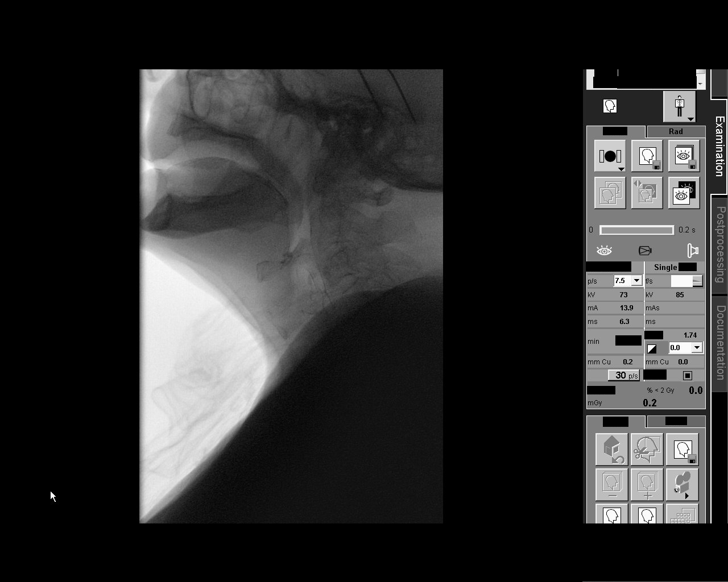

[Series 8: run · 2 of 20 frames shown (7 of 12)]
[frame 4/20]
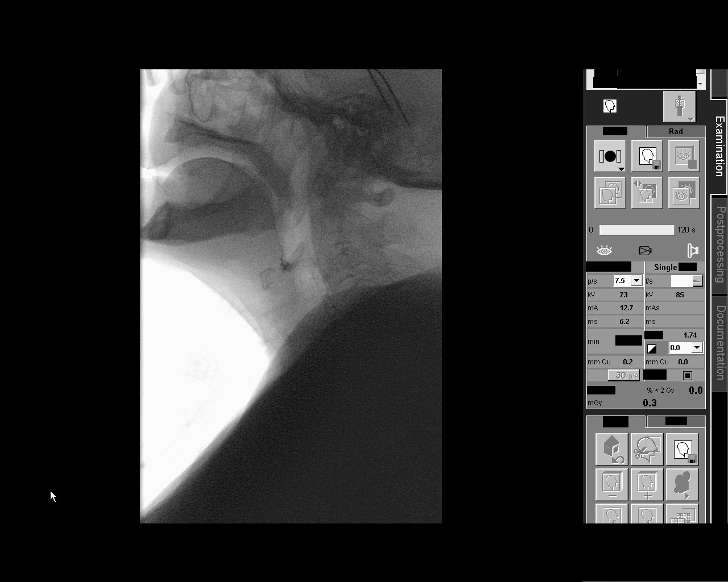
[frame 18/20]
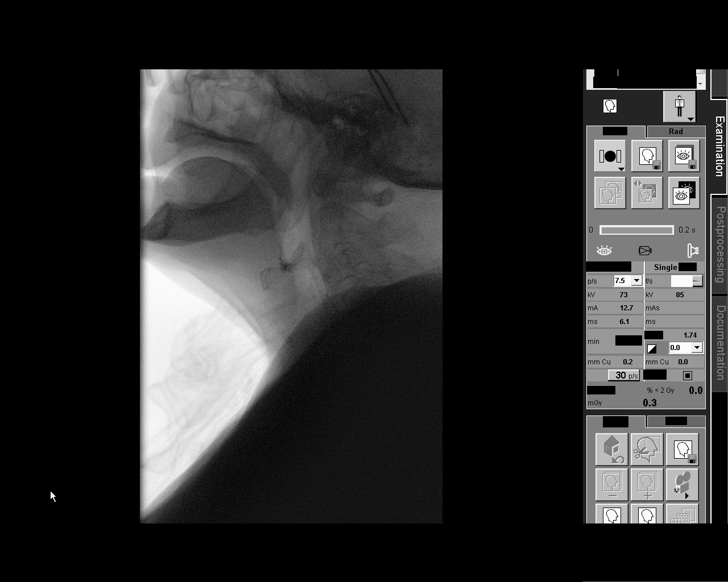

[Series 9: run · 1 of 50 frames shown (8 of 12)]
[frame 43/50]
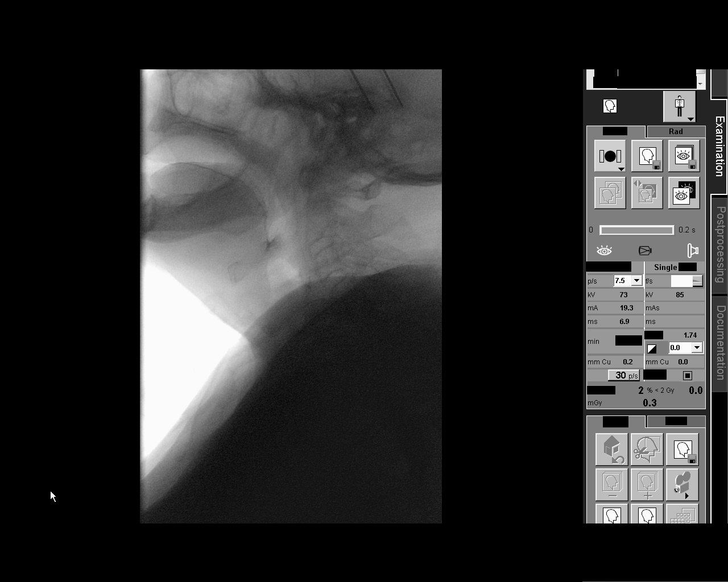

[Series 10: run · 1 of 394 frames shown (9 of 12)]
[frame 335/394]
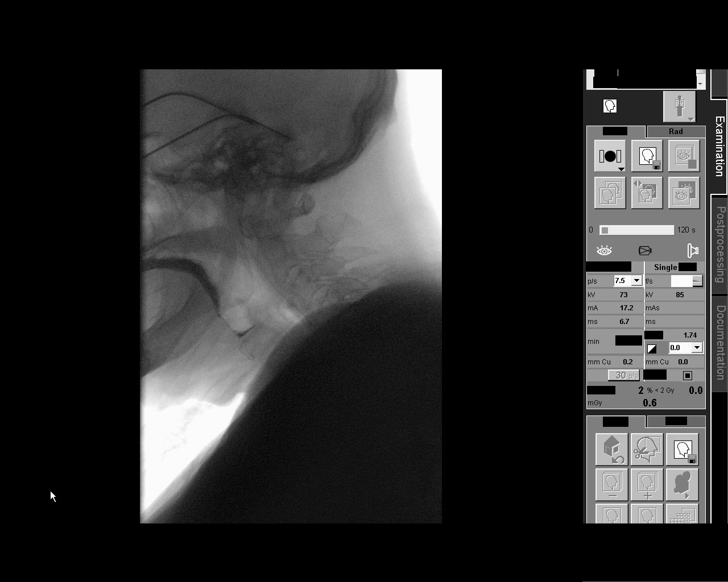

[Series 11: run · 1 of 67 frames shown (10 of 12)]
[frame 57/67]
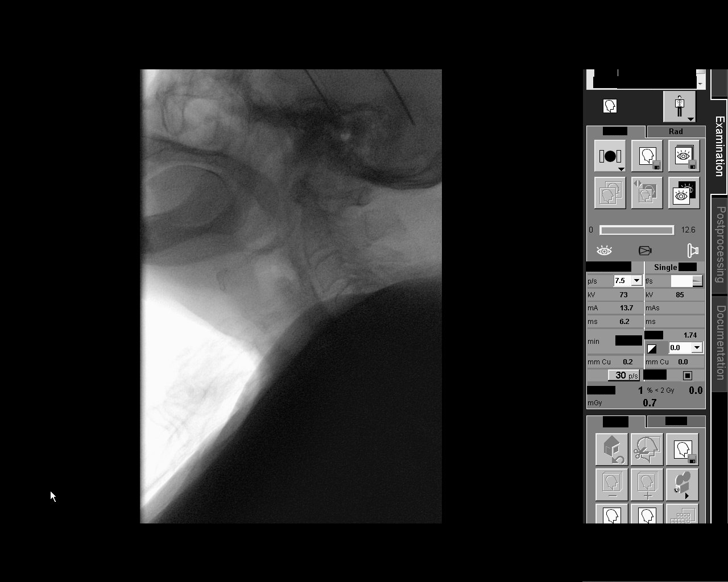

[Series 12: run · 1 of 61 frames shown (11 of 12)]
[frame 52/61]
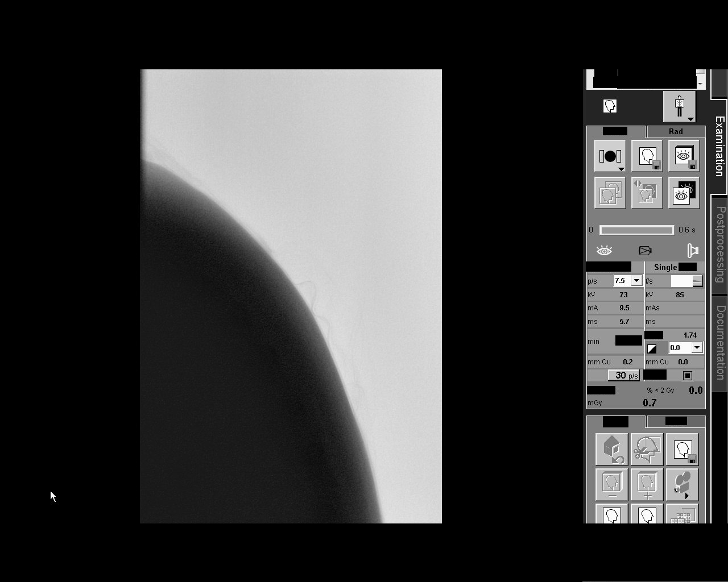

[Series 13: run · 1 of 132 frames shown (12 of 12)]
[frame 113/132]
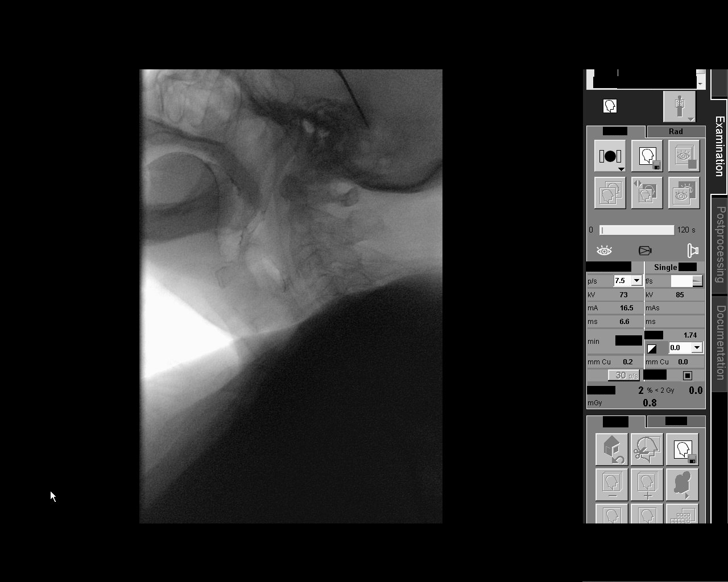

[13 of 24 positions shown; findings below may reference images not displayed]

FINDINGS: Thin liquid- within normal limits

Nectar thick liquid- within normal limits

Honey- within normal limits

Angelika Angie?De Lux within normal limits

Angelika Angie?Tamya with cracker- within normal limits
IMPRESSION: Normal modified barium swallow.

Please refer to the Speech Pathologists report for complete details
and recommendations.

## 2018-01-17 ENCOUNTER — Observation Stay: Payer: Medicare Other

## 2018-01-17 ENCOUNTER — Observation Stay
Admission: EM | Admit: 2018-01-17 | Discharge: 2018-01-19 | Disposition: A | Discharge status: Nursing Home (Includes ICF) | Payer: Medicare Other | Attending: Internal Medicine | Admitting: Internal Medicine

## 2018-01-17 ENCOUNTER — Other Ambulatory Visit: Payer: Self-pay

## 2018-01-17 DIAGNOSIS — Z79899 Other long term (current) drug therapy: Secondary | ICD-10-CM | POA: Insufficient documentation

## 2018-01-17 DIAGNOSIS — I129 Hypertensive chronic kidney disease with stage 1 through stage 4 chronic kidney disease, or unspecified chronic kidney disease: Secondary | ICD-10-CM | POA: Insufficient documentation

## 2018-01-17 DIAGNOSIS — K219 Gastro-esophageal reflux disease without esophagitis: Secondary | ICD-10-CM | POA: Diagnosis not present

## 2018-01-17 DIAGNOSIS — I491 Atrial premature depolarization: Secondary | ICD-10-CM | POA: Insufficient documentation

## 2018-01-17 DIAGNOSIS — K449 Diaphragmatic hernia without obstruction or gangrene: Secondary | ICD-10-CM | POA: Insufficient documentation

## 2018-01-17 DIAGNOSIS — R0989 Other specified symptoms and signs involving the circulatory and respiratory systems: Secondary | ICD-10-CM

## 2018-01-17 DIAGNOSIS — K5289 Other specified noninfective gastroenteritis and colitis: Secondary | ICD-10-CM | POA: Diagnosis not present

## 2018-01-17 DIAGNOSIS — E861 Hypovolemia: Secondary | ICD-10-CM | POA: Diagnosis not present

## 2018-01-17 DIAGNOSIS — N400 Enlarged prostate without lower urinary tract symptoms: Secondary | ICD-10-CM | POA: Insufficient documentation

## 2018-01-17 DIAGNOSIS — K529 Noninfective gastroenteritis and colitis, unspecified: Secondary | ICD-10-CM

## 2018-01-17 DIAGNOSIS — F329 Major depressive disorder, single episode, unspecified: Secondary | ICD-10-CM | POA: Diagnosis not present

## 2018-01-17 DIAGNOSIS — Z87891 Personal history of nicotine dependence: Secondary | ICD-10-CM | POA: Insufficient documentation

## 2018-01-17 DIAGNOSIS — Z6827 Body mass index (BMI) 27.0-27.9, adult: Secondary | ICD-10-CM | POA: Insufficient documentation

## 2018-01-17 DIAGNOSIS — X58XXXA Exposure to other specified factors, initial encounter: Secondary | ICD-10-CM | POA: Insufficient documentation

## 2018-01-17 DIAGNOSIS — J449 Chronic obstructive pulmonary disease, unspecified: Secondary | ICD-10-CM | POA: Diagnosis not present

## 2018-01-17 DIAGNOSIS — E860 Dehydration: Secondary | ICD-10-CM | POA: Diagnosis present

## 2018-01-17 DIAGNOSIS — Z794 Long term (current) use of insulin: Secondary | ICD-10-CM | POA: Insufficient documentation

## 2018-01-17 DIAGNOSIS — E669 Obesity, unspecified: Secondary | ICD-10-CM | POA: Insufficient documentation

## 2018-01-17 DIAGNOSIS — E7800 Pure hypercholesterolemia, unspecified: Secondary | ICD-10-CM | POA: Diagnosis not present

## 2018-01-17 DIAGNOSIS — I2510 Atherosclerotic heart disease of native coronary artery without angina pectoris: Secondary | ICD-10-CM | POA: Diagnosis not present

## 2018-01-17 DIAGNOSIS — N189 Chronic kidney disease, unspecified: Secondary | ICD-10-CM | POA: Insufficient documentation

## 2018-01-17 DIAGNOSIS — I447 Left bundle-branch block, unspecified: Secondary | ICD-10-CM | POA: Insufficient documentation

## 2018-01-17 DIAGNOSIS — F419 Anxiety disorder, unspecified: Secondary | ICD-10-CM | POA: Insufficient documentation

## 2018-01-17 DIAGNOSIS — F0390 Unspecified dementia without behavioral disturbance: Secondary | ICD-10-CM | POA: Diagnosis not present

## 2018-01-17 DIAGNOSIS — I252 Old myocardial infarction: Secondary | ICD-10-CM | POA: Insufficient documentation

## 2018-01-17 DIAGNOSIS — I959 Hypotension, unspecified: Secondary | ICD-10-CM

## 2018-01-17 DIAGNOSIS — Z7902 Long term (current) use of antithrombotics/antiplatelets: Secondary | ICD-10-CM | POA: Insufficient documentation

## 2018-01-17 DIAGNOSIS — N179 Acute kidney failure, unspecified: Secondary | ICD-10-CM | POA: Diagnosis not present

## 2018-01-17 DIAGNOSIS — Z7982 Long term (current) use of aspirin: Secondary | ICD-10-CM | POA: Insufficient documentation

## 2018-01-17 DIAGNOSIS — E1122 Type 2 diabetes mellitus with diabetic chronic kidney disease: Secondary | ICD-10-CM | POA: Diagnosis not present

## 2018-01-17 DIAGNOSIS — E785 Hyperlipidemia, unspecified: Secondary | ICD-10-CM | POA: Insufficient documentation

## 2018-01-17 DIAGNOSIS — I4891 Unspecified atrial fibrillation: Secondary | ICD-10-CM | POA: Diagnosis not present

## 2018-01-17 DIAGNOSIS — S80819A Abrasion, unspecified lower leg, initial encounter: Secondary | ICD-10-CM | POA: Insufficient documentation

## 2018-01-17 DIAGNOSIS — I7789 Other specified disorders of arteries and arterioles: Secondary | ICD-10-CM

## 2018-01-17 DIAGNOSIS — F79 Unspecified intellectual disabilities: Secondary | ICD-10-CM | POA: Insufficient documentation

## 2018-01-17 LAB — LIPASE, BLOOD: Lipase: 41 U/L (ref 11–51)

## 2018-01-17 LAB — HEMOGLOBIN AND HEMATOCRIT, BLOOD
HCT: 40.1 % (ref 40.0–52.0)
Hemoglobin: 13.6 g/dL (ref 13.0–18.0)

## 2018-01-17 LAB — GASTROINTESTINAL PANEL BY PCR, STOOL (REPLACES STOOL CULTURE)

## 2018-01-17 LAB — COMPREHENSIVE METABOLIC PANEL
ALK PHOS: 68 U/L (ref 38–126)
ALT: 18 U/L (ref 17–63)
AST: 28 U/L (ref 15–41)
Albumin: 3.5 g/dL (ref 3.5–5.0)
Anion gap: 11 (ref 5–15)
BILIRUBIN TOTAL: 0.9 mg/dL (ref 0.3–1.2)
BUN: 37 mg/dL — AB (ref 6–20)
CALCIUM: 9.2 mg/dL (ref 8.9–10.3)
CO2: 25 mmol/L (ref 22–32)
CREATININE: 1.77 mg/dL — AB (ref 0.61–1.24)
Chloride: 100 mmol/L — ABNORMAL LOW (ref 101–111)
GFR calc Af Amer: 40 mL/min — ABNORMAL LOW (ref >60)
GFR calc non Af Amer: 35 mL/min — ABNORMAL LOW (ref >60)
GLUCOSE: 203 mg/dL — AB (ref 65–99)
POTASSIUM: 4 mmol/L (ref 3.5–5.1)
Sodium: 136 mmol/L (ref 135–145)
TOTAL PROTEIN: 7.4 g/dL (ref 6.5–8.1)

## 2018-01-17 LAB — CBC
HEMATOCRIT: 42 % (ref 40.0–52.0)
HEMOGLOBIN: 14.2 g/dL (ref 13.0–18.0)
MCH: 31.8 pg (ref 26.0–34.0)
MCHC: 33.8 g/dL (ref 32.0–36.0)
MCV: 93.9 fL (ref 80.0–100.0)
Platelets: 284 10*3/uL (ref 150–440)
RBC: 4.48 MIL/uL (ref 4.40–5.90)
RDW: 13.2 % (ref 11.5–14.5)
WBC: 8.1 10*3/uL (ref 3.8–10.6)

## 2018-01-17 LAB — URINALYSIS, COMPLETE (UACMP) WITH MICROSCOPIC
Bilirubin Urine: NEGATIVE
Glucose, UA: NEGATIVE mg/dL
Ketones, ur: NEGATIVE mg/dL
NITRITE: NEGATIVE
Protein, ur: NEGATIVE mg/dL
Specific Gravity, Urine: 1.006 (ref 1.005–1.030)
pH: 7 (ref 5.0–8.0)

## 2018-01-17 LAB — GLUCOSE, CAPILLARY
Glucose-Capillary: 183 mg/dL — ABNORMAL HIGH (ref 65–99)
Glucose-Capillary: 227 mg/dL — ABNORMAL HIGH (ref 65–99)

## 2018-01-17 LAB — C DIFFICILE QUICK SCREEN W PCR REFLEX
C DIFFICLE (CDIFF) ANTIGEN: NEGATIVE
C Diff interpretation: NOT DETECTED
C Diff toxin: NEGATIVE

## 2018-01-17 LAB — TSH: TSH: 3.263 u[IU]/mL (ref 0.350–4.500)

## 2018-01-17 LAB — TROPONIN I: TROPONIN I: 0.06 ng/mL — AB (ref <0.03)

## 2018-01-17 MED ORDER — PANTOPRAZOLE SODIUM 40 MG PO TBEC
40.0000 mg | DELAYED_RELEASE_TABLET | Freq: Two times a day (BID) | ORAL | Status: DC
  Administered 2018-01-17 – 2018-01-19 (×4): 40 mg via ORAL
  Filled 2018-01-17 (×4): qty 1

## 2018-01-17 MED ORDER — SODIUM CHLORIDE 0.9 % IV BOLUS (SEPSIS)
1000.0000 mL | Freq: Once | INTRAVENOUS | Status: AC
  Administered 2018-01-17: 1000 mL via INTRAVENOUS

## 2018-01-17 MED ORDER — ADULT MULTIVITAMIN W/MINERALS CH
1.0000 | ORAL_TABLET | Freq: Every day | ORAL | Status: DC
  Administered 2018-01-17 – 2018-01-19 (×3): 1 via ORAL
  Filled 2018-01-17 (×3): qty 1

## 2018-01-17 MED ORDER — RANOLAZINE ER 500 MG PO TB12
500.0000 mg | ORAL_TABLET | Freq: Two times a day (BID) | ORAL | Status: DC
  Administered 2018-01-17 – 2018-01-19 (×4): 500 mg via ORAL
  Filled 2018-01-17 (×5): qty 1

## 2018-01-17 MED ORDER — TAMSULOSIN HCL 0.4 MG PO CAPS
0.4000 mg | ORAL_CAPSULE | Freq: Every day | ORAL | Status: DC
  Administered 2018-01-18 – 2018-01-19 (×2): 0.4 mg via ORAL
  Filled 2018-01-17 (×2): qty 1

## 2018-01-17 MED ORDER — ACETAMINOPHEN 650 MG RE SUPP
650.0000 mg | Freq: Four times a day (QID) | RECTAL | Status: DC | PRN
Start: 2018-01-17 — End: 2018-01-19

## 2018-01-17 MED ORDER — ASPIRIN 81 MG PO CHEW: 81.0000 mg | CHEWABLE_TABLET | Freq: Every day | ORAL | Status: DC

## 2018-01-17 MED ORDER — ACETAMINOPHEN 325 MG PO TABS
650.0000 mg | ORAL_TABLET | Freq: Four times a day (QID) | ORAL | Status: DC | PRN
  Administered 2018-01-19: 650 mg via ORAL
  Filled 2018-01-17: qty 2

## 2018-01-17 MED ORDER — CITALOPRAM HYDROBROMIDE 20 MG PO TABS
20.0000 mg | ORAL_TABLET | Freq: Every day | ORAL | Status: DC
  Administered 2018-01-18 – 2018-01-19 (×2): 20 mg via ORAL
  Filled 2018-01-17 (×2): qty 1

## 2018-01-17 MED ORDER — ISOSORBIDE MONONITRATE ER 30 MG PO TB24
60.0000 mg | ORAL_TABLET | Freq: Two times a day (BID) | ORAL | Status: DC
  Administered 2018-01-17 – 2018-01-19 (×4): 60 mg via ORAL
  Filled 2018-01-17 (×4): qty 2

## 2018-01-17 MED ORDER — CLOPIDOGREL BISULFATE 75 MG PO TABS: 75.0000 mg | ORAL_TABLET | Freq: Every day | ORAL | Status: DC

## 2018-01-17 MED ORDER — MOMETASONE FURO-FORMOTEROL FUM 100-5 MCG/ACT IN AERO
2.0000 | INHALATION_SPRAY | Freq: Two times a day (BID) | RESPIRATORY_TRACT | Status: DC
  Administered 2018-01-17 – 2018-01-19 (×4): 2 via RESPIRATORY_TRACT
  Filled 2018-01-17: qty 8.8

## 2018-01-17 MED ORDER — SODIUM CHLORIDE 0.9 % IV SOLN
INTRAVENOUS | Status: AC
  Administered 2018-01-17 – 2018-01-18 (×2): via INTRAVENOUS

## 2018-01-17 MED ORDER — FINASTERIDE 5 MG PO TABS
5.0000 mg | ORAL_TABLET | Freq: Every day | ORAL | Status: DC
  Administered 2018-01-18 – 2018-01-19 (×2): 5 mg via ORAL
  Filled 2018-01-17 (×2): qty 1

## 2018-01-17 MED ORDER — HEPARIN SODIUM (PORCINE) 5000 UNIT/ML IJ SOLN
5000.0000 [IU] | Freq: Three times a day (TID) | INTRAMUSCULAR | Status: DC
  Administered 2018-01-17 – 2018-01-19 (×4): 5000 [IU] via SUBCUTANEOUS
  Filled 2018-01-17 (×4): qty 1

## 2018-01-17 MED ORDER — ONDANSETRON HCL 4 MG PO TABS: 4.0000 mg | ORAL_TABLET | Freq: Four times a day (QID) | ORAL | Status: DC | PRN

## 2018-01-17 MED ORDER — INSULIN ASPART 100 UNIT/ML ~~LOC~~ SOLN
0.0000 [IU] | Freq: Every day | SUBCUTANEOUS | Status: DC
  Administered 2018-01-17: 2 [IU] via SUBCUTANEOUS
  Filled 2018-01-17: qty 1

## 2018-01-17 MED ORDER — ONDANSETRON HCL 4 MG/2ML IJ SOLN
4.0000 mg | Freq: Once | INTRAMUSCULAR | Status: AC
  Administered 2018-01-17: 4 mg via INTRAVENOUS
  Filled 2018-01-17: qty 2

## 2018-01-17 MED ORDER — IPRATROPIUM-ALBUTEROL 0.5-2.5 (3) MG/3ML IN SOLN: 3.0000 mL | Freq: Four times a day (QID) | RESPIRATORY_TRACT | Status: DC | PRN

## 2018-01-17 MED ORDER — ALBUTEROL SULFATE (2.5 MG/3ML) 0.083% IN NEBU: 3.0000 mL | INHALATION_SOLUTION | Freq: Four times a day (QID) | RESPIRATORY_TRACT | Status: DC | PRN

## 2018-01-17 MED ORDER — ONDANSETRON HCL 4 MG/2ML IJ SOLN
4.0000 mg | Freq: Four times a day (QID) | INTRAMUSCULAR | Status: DC | PRN
Start: 2018-01-17 — End: 2018-01-19

## 2018-01-17 MED ORDER — LORATADINE 10 MG PO TABS
10.0000 mg | ORAL_TABLET | Freq: Every day | ORAL | Status: DC
  Administered 2018-01-18 – 2018-01-19 (×2): 10 mg via ORAL
  Filled 2018-01-17 (×2): qty 1

## 2018-01-17 MED ORDER — HYDROCERIN EX CREA
TOPICAL_CREAM | Freq: Two times a day (BID) | CUTANEOUS | Status: DC
  Administered 2018-01-17 – 2018-01-19 (×4): via TOPICAL
  Filled 2018-01-17: qty 113

## 2018-01-17 MED ORDER — INSULIN ASPART 100 UNIT/ML ~~LOC~~ SOLN
0.0000 [IU] | Freq: Three times a day (TID) | SUBCUTANEOUS | Status: DC
  Administered 2018-01-17 – 2018-01-19 (×2): 3 [IU] via SUBCUTANEOUS
  Filled 2018-01-17 (×2): qty 1

## 2018-01-17 MED ORDER — NITROGLYCERIN 0.4 MG SL SUBL: 0.4000 mg | SUBLINGUAL_TABLET | SUBLINGUAL | Status: DC | PRN

## 2018-01-17 NOTE — ED Notes (Signed)
Spoke with Dr. Katheren ShamsSalary.  VO given to do Hgb & Hct checks Q8 hours and to consult GI due to recent issue.  No other changes to POC at this time.

## 2018-01-17 NOTE — ED Notes (Signed)
Date and time results received: 01/17/18 4:34 PM  Test: Trop Critical Value: 0.06  Name of Provider Notified: Dr. Lenard LancePaduchowski  Orders Received? Or Actions Taken?: Acknowledged

## 2018-01-17 NOTE — H&P (Signed)
[facility] [place] at [facility]   PATIENT NAME: Ralph Boyd    MR#:  [id number]  DATE OF BIRTH:  Apr 23, 1936  DATE OF ADMISSION:  01/17/2018  PRIMARY CARE PHYSICIAN: [facility]   REQUESTING/REFERRING PHYSICIAN:   CHIEF COMPLAINT:   Chief Complaint  Patient presents with  . Emesis  . Diarrhea    HISTORY OF PRESENT ILLNESS: Ralph Boyd  is a 82 y.o. male with a known history per below, presenting from local nursing facility, patient sat down on the floor and refused to get up, upon standing with assistance patient began to have nausea/vomiting with diarrhea, noted blood pressure in the 70s, in the emergency room blood pressure was noted to be in the 80s systolic, creatinine 1.7 with baseline normal, troponin 0.06, CBC normal, glucose 203, patient evaluated in the emergency room, no apparent distress, history taking limited due to developmental disability/MR, noted abdominal distention without tenderness/pain, patient is now been admitted for acute probable viral gastroenteritis, acute abdominal distention.  PAST MEDICAL HISTORY:   Past Medical History:  Diagnosis Date  . Acid reflux   . Anxiety   . Asthma   . Atrial fibrillation (HCC)   . BPH (benign prostatic hyperplasia)   . Heart disease   . HLD (hyperlipidemia)   . HTN (hypertension)   . Kidney disease   . MI (myocardial infarction) (HCC)     PAST SURGICAL HISTORY:  Past Surgical History:  Procedure Laterality Date  . HERNIA REPAIR      SOCIAL HISTORY:  Social History   Tobacco Use  . Smoking status: Former Games developer  . Tobacco comment: Quit 6 years  Substance Use Topics  . Alcohol use: No    Alcohol/week: 0.0 oz    FAMILY HISTORY: Unknown given developmental disability/mental retardation  DRUG ALLERGIES: KNDA  REVIEW OF SYSTEMS: Unable to be obtained given developmental disability/mental retardation  MEDICATIONS AT HOME:  Prior to Admission medications    Medication Sig Start Date End Date Taking? Authorizing Provider  acetaminophen (TYLENOL) 325 MG tablet Take 650 mg by mouth every 6 (six) hours as needed.   Yes [provider]  amLODipine (NORVASC) 5 MG tablet TAKE 1 TABLET BY MOUTH  DAILY. 02/12/15  Yes [provider]  aspirin 81 MG chewable tablet CHEW AND SWALLOW ONE TABLET BY MOUTH ONCE DAILY. 02/12/15  Yes [provider]  citalopram (CELEXA) 20 MG tablet TAKE 1 TABLET BY MOUTH ONCE DAILY. 02/12/15  Yes [provider]  clopidogrel (PLAVIX) 75 MG tablet TAKE 1 TABLET BY MOUTH ONCE DAILY. 02/12/15  Yes [provider]  finasteride (PROSCAR) 5 MG tablet TAKE 1 TABLET BY MOUTH DAILY. 02/12/15  Yes [provider]  fluticasone-salmeterol (ADVAIR HFA) 45-21 MCG/ACT inhaler INHALE 2 PUFFS BY MOUTH TWICE DAILY. **RINSE MOUTH AFTER EACH USE** 02/12/15  Yes [provider]  guaiFENesin (ROBITUSSIN) 100 MG/5ML liquid Take 300 mg by mouth 3 (three) times daily as needed for cough.   Yes [provider]  isosorbide mononitrate (IMDUR) 60 MG 24 hr tablet TAKE 1 TABLET BY MOUTH TWICE DAILY. 02/12/15  Yes [provider]  lisinopril (PRINIVIL,ZESTRIL) 10 MG tablet Take 10 mg by mouth daily.    Yes [provider]  loratadine (CLARITIN) 10 MG tablet Take 10 mg by mouth daily.   Yes [provider]  mineral oil external liquid 1 application by Does not apply route daily as needed.   Yes [provider]  pantoprazole (PROTONIX) 40 MG  tablet Take 40 mg by mouth 2 (two) times daily.   Yes [provider]  polyethylene glycol (MIRALAX / GLYCOLAX) packet Take 17 g by mouth daily.   Yes [provider]  psyllium (METAMUCIL) 0.52 g capsule Take 0.52 g by mouth 2 (two) times daily. Reported on 04/16/2016   Yes [provider]  ranolazine (RANEXA) 500 MG 12 hr tablet Take 500 mg by mouth 2 (two) times daily.   Yes [provider]   Skin Protectants, Misc. (HYDROCERIN EX) Apply 1 application topically 2 (two) times daily.   Yes [provider]  tamsulosin (FLOMAX) 0.4 MG CAPS capsule Take 1 capsule (0.4 mg total) by mouth daily. 07/03/15  Yes Crist FatHerrick, Benjamin W, MD  albuterol [facility](PROAIR HFA) 108 (90 BASE) MCG/ACT inhaler Inhale 2 puffs into the lungs every 6 (six) hours as needed.  12/28/14 12/28/15  [provider]  furosemide (LASIX) 20 MG tablet Take 40 mg by mouth 2 (two) times daily. Reported on 04/16/2016 12/17/15 12/16/16  [provider]  loperamide (IMODIUM A-D) 2 MG tablet Take 4 mg by mouth as needed.     [provider]  nitroGLYCERIN (NITROSTAT) 0.4 MG SL tablet Place one tablet under tongue as needed.    [provider]      PHYSICAL EXAMINATION:   VITAL SIGNS: Blood pressure (!) 111/46, pulse 95, temperature (!) 97.5 F (36.4 C), temperature source Oral, resp. rate (!) 38, weight 82.1 kg (181 lb), SpO2 95 %.  GENERAL:  82 y.o.-year-old patient lying in the bed with no acute distress.  Frail-appearing moderate EYES: Pupils equal, round, reactive to light and accommodation. No scleral icterus. Extraocular muscles intact.  HEENT: Head atraumatic, normocephalic. Oropharynx and nasopharynx clear.  Dry mucous membranes NECK:  Supple, no jugular venous distention. No thyroid enlargement, no tenderness.  Poor skin turgor LUNGS: Normal breath sounds bilaterally, no wheezing, rales,rhonchi or crepitation. No use of accessory muscles of respiration.  CARDIOVASCULAR: S1, S2 normal. No murmurs, rubs, or gallops.  ABDOMEN: Soft, abdominal distention without tenderness, no rebound, no guarding.hypoactive bowel sounds present. No organomegaly or mass.  EXTREMITIES: No pedal edema, cyanosis, or clubbing.  NEUROLOGIC: Cranial nerves II through XII are intact. MAES Gait not checked.  PSYCHIATRIC: The patient is awake, alert, confused and disoriented.  SKIN: No obvious rash, lesion, or  ulcer.   LABORATORY PANEL:   CBC Recent Labs  Lab 01/17/18 1419  WBC 8.1  HGB 14.2  HCT 42.0  PLT 284  MCV 93.9  MCH 31.8  MCHC 33.8  RDW 13.2   ------------------------------------------------------------------------------------------------------------------  Chemistries  Recent Labs  Lab 01/17/18 1419  NA 136  K 4.0  CL 100*  CO2 25  GLUCOSE 203*  BUN 37*  CREATININE 1.77*  CALCIUM 9.2  AST 28  ALT 18  ALKPHOS 68  BILITOT 0.9   ------------------------------------------------------------------------------------------------------------------ CrCl cannot be calculated (Unknown ideal weight.). ------------------------------------------------------------------------------------------------------------------ No results for input(s): TSH, T4TOTAL, T3FREE, THYROIDAB in the last 72 hours.  Invalid input(s): FREET3   Coagulation profile No results for input(s): INR, PROTIME in the last 168 hours. ------------------------------------------------------------------------------------------------------------------- No results for input(s): DDIMER in the last 72 hours. -------------------------------------------------------------------------------------------------------------------  Cardiac Enzymes Recent Labs  Lab 01/17/18 1419  TROPONINI 0.06*   ------------------------------------------------------------------------------------------------------------------ Invalid input(s): POCBNP  ---------------------------------------------------------------------------------------------------------------  Urinalysis    Component Value Date/Time   COLORURINE Yellow 01/13/2014 1010   APPEARANCEUR Cloudy (A) 04/16/2016 1557   LABSPEC 1.017 01/13/2014 1010   PHURINE 5.0 01/13/2014 1010   GLUCOSEU Negative  04/16/2016 1557   GLUCOSEU Negative 01/13/2014 1010   HGBUR Negative 01/13/2014 1010   BILIRUBINUR Negative 04/16/2016 1557   BILIRUBINUR Negative 01/13/2014 1010    KETONESUR Negative 01/13/2014 1010   PROTEINUR Negative 04/16/2016 1557   PROTEINUR Negative 01/13/2014 1010   NITRITE Negative 04/16/2016 1557   NITRITE Negative 01/13/2014 1010   LEUKOCYTESUR 2+ (A) 04/16/2016 1557   LEUKOCYTESUR Negative 01/13/2014 1010     RADIOLOGY: No results found.  EKG: Orders placed or performed during the hospital encounter of 01/17/18  . EKG 12-Lead  . EKG 12-Lead    IMPRESSION AND PLAN: 1 acute kidney injury Most likely secondary to acute viral gastroenteritis Secondary to acute prerenal process/acute dehydration Admit to regular nursing floor bed, IV fluids for rehydration, avoid nephrotoxic agents, strict I&O monitoring, BMP daily  2 acute probable viral gastroenteritis Presented from local nursing home facility with nausea/vomiting/diarrhea Patient presented antiemetics PRN, IV fluids for rehydration, follow-up on GI panel, clear liquid diet for now  3 acute abdominal distention Most likely secondary to above Check acute abdominal series for further evaluation  4 acute hypotension Secondary to acute gastroenteritis, dehydration, antihypertensives, and diuretic side effect IV fluids for rehydration, will hold antihypertensives for now which also includes diuretic medication, vitals per routine, make changes as per necessary  5 chronic developmental disability/MR Increase nursing care as needed, aspiration/fall precautions  6 chronic BPH Stable Continue Flomax  7 chronic diabetes mellitus type 2 Sliding scale insulin with Accu-Cheks per routine, check hemoglobin A1c to determine level of control  8 history of asthma, without exacerbation Breathing treatments as needed  9 acute leg abrasions Wound care nurse consulted to evaluate/treat   All the records are reviewed and case discussed with ED provider. Management plans discussed with the patient, family and they are in agreement.  CODE STATUS:DNR Code Status History    This  patient does not have a recorded code status. Please follow your organizational policy for patients in this situation.    Advance Directive Documentation     Most Recent Value  Type of Advance Directive  Out of facility DNR (pink MOST or yellow form)  Pre-existing out of facility DNR order (yellow form or pink MOST form)  Yellow form placed in chart (order not valid for inpatient use)  "MOST" Form in Place?  No data       TOTAL TIME TAKING CARE OF THIS PATIENT: 45 minutes.    Evelena Asa Lanice Folden M.D on 01/17/2018   Between 7am to 6pm - Pager - [phone]  After 6pm go to [email or web address] - Social research officer, government  [facility]  Office  [phone]  CC: Primary care physician; [facility]   Note: This dictation was prepared with Dragon dictation along with smaller phrase technology. Any transcriptional errors that result from this process are unintentional.

## 2018-01-17 NOTE — ED Triage Notes (Signed)
[facility] [place].  Pt has MR and is not a good historian at baseline.  Pt vomited x1 with EMS and diarrhea x2.  Pt cleaned up prior to EDP evaluation.  Pt is alert and oriented to self, which is known to be his baseline.  Per EMS, pt came in after walking down hallway at nursing home and just laying in the floor.  Pt denies knowing reason for being here.

## 2018-01-17 NOTE — ED Provider Notes (Signed)
[facility] Emergency Department Provider Note  Time seen: 2:25 PM  I have reviewed the triage vital signs and the nursing notes.   HISTORY  Chief Complaint Emesis and Diarrhea    HPI Ralph Boyd is a 82 y.o. male with a past medical history of asthma, atrial fibrillation, hypertension, hyperlipidemia, MI, obesity, presents to the emergency department from his nursing facility for diarrhea and vomiting.  According to EMS report the patient was at his nursing facility and sat down on the ground and refused to get up.  Became nauseated and began vomiting.  They transported the patient to the emergency department where he vomited again.  Upon arrival to the emergency department patient had a very large loose light brown bowel movement.  Patient has a history of mental retardation, denies any complaints at this time.  But does not recall vomiting.  Does not know why he is here.  Does not know the date, etc.  Very poor historian.  Currently lying in bed appears comfortable, no distress.  Past Medical History:  Diagnosis Date  . Acid reflux   . Anxiety   . Asthma   . Atrial fibrillation (HCC)   . BPH (benign prostatic hyperplasia)   . Heart disease   . HLD (hyperlipidemia)   . HTN (hypertension)   . Kidney disease   . MI (myocardial infarction)     Patient Active Problem List   Diagnosis Date Noted  . Arteriosclerosis of coronary artery 07/27/2015  . Diabetes mellitus, type 2 (HCC) 07/27/2015  . Hypercholesteremia 07/27/2015  . BP (high blood pressure) 07/27/2015  . Intellectual disability 07/13/2015  . COPD, moderate (HCC) 07/28/2014     Prior to Admission medications   Medication Sig Start Date End Date Taking? Authorizing Provider  albuterol (PROAIR HFA) 108 (90 BASE) MCG/ACT inhaler Inhale into the lungs. 12/28/14 12/28/15  [provider]  aluminum hydroxide-magnesium carbonate (GAVISCON) 95-358 MG/15ML SUSP Take by mouth.     [provider]  amitriptyline (ELAVIL) 50 MG tablet TAKE 1/2 TABLET BY MOUTH AT BEDTIME. 02/12/15   [provider]  amLODipine (NORVASC) 5 MG tablet TAKE 1 TABLET BY MOUTH TWICE DAILY. 02/12/15   [provider]  aspirin 81 MG chewable tablet CHEW AND SWALLOW ONE TABLET BY MOUTH ONCE DAILY. 02/12/15   [provider]  citalopram (CELEXA) 20 MG tablet TAKE 1 TABLET BY MOUTH ONCE DAILY. 02/12/15   [provider]  clopidogrel (PLAVIX) 75 MG tablet TAKE 1 TABLET BY MOUTH ONCE DAILY. 02/12/15   [provider]  esomeprazole (NEXIUM) 40 MG capsule TAKE 1 CAPSULE BY MOUTH DAILY. 02/12/15   [provider]  finasteride (PROSCAR) 5 MG tablet TAKE 1 TABLET BY MOUTH DAILY. 02/12/15   [provider]  fluticasone-salmeterol (ADVAIR HFA) 45-21 MCG/ACT inhaler INHALE 2 PUFFS BY MOUTH TWICE DAILY. **RINSE MOUTH AFTER EACH USE** 02/12/15   [provider]  furosemide (LASIX) 20 MG tablet Take by mouth. Reported on 04/16/2016 12/17/15 12/16/16  [provider]  isosorbide mononitrate (IMDUR) 60 MG 24 hr tablet TAKE 1 TABLET BY MOUTH TWICE DAILY. 02/12/15   [provider]  lisinopril (PRINIVIL,ZESTRIL) 5 MG tablet Take by mouth.    [provider]  loperamide (IMODIUM A-D) 2 MG tablet Take by mouth.    [provider]  LORazepam (ATIVAN) 0.5 MG tablet Take by mouth. Reported on 04/16/2016    [provider]  lubiprostone (AMITIZA) 24 MCG capsule Take by mouth. Reported  on 04/16/2016 02/15/15   [provider]  metoprolol succinate (TOPROL-XL) 25 MG 24 hr tablet Take 25 mg by mouth daily. Reported on 04/16/2016    [provider]  metoprolol tartrate (LOPRESSOR) 25 MG tablet Take by mouth. Reported on 04/16/2016 06/19/15   [provider]  mirabegron ER (MYRBETRIQ) 50 MG TB24 tablet Take 50 mg by mouth daily. Reported on 04/16/2016    [provider]  nitroGLYCERIN  (NITROSTAT) 0.4 MG SL tablet Place one tablet under tongue as needed.    [provider]  permethrin Verner Mould) 5 % cream Reported on 04/16/2016 02/05/16   [provider]  pravastatin (PRAVACHOL) 40 MG tablet Reported on 04/16/2016 02/12/15   [provider]  psyllium (METAMUCIL) 0.52 g capsule Take by mouth. Reported on 04/16/2016    [provider]  quinapril (ACCUPRIL) 20 MG tablet Reported on 04/16/2016 02/12/15   [provider]  Skin Protectants, Misc. (MINERIN) CREA Reported on 04/16/2016 02/12/15   [provider]  tamsulosin (FLOMAX) 0.4 MG CAPS capsule Take 1 capsule (0.4 mg total) by mouth daily. Patient not taking: Reported on 04/16/2016 07/03/15   Crist Fat, MD    No Known Allergies  No family history on file.  Social History Social History   Tobacco Use  . Smoking status: Former Games developer  . Tobacco comment: Quit 6 years  Substance Use Topics  . Alcohol use: No    Alcohol/week: 0.0 oz  . Drug use: No    Review of Systems Unable to obtain review of systems due to baseline mental retardation.  ____________________________________________   PHYSICAL EXAM:  Constitutional: Alert, no acute distress.  Oriented to person and place but not situation or time. Eyes: Normal exam ENT   Head: Normocephalic and atraumatic.   Nose: No congestion/rhinnorhea.   Mouth/Throat: Mucous membranes are moist. Cardiovascular: Normal rate, regular rhythm. Respiratory: Normal respiratory effort without tachypnea nor retractions. Breath sounds are clear  Gastrointestinal: Soft and nontender. No distention.  Musculoskeletal: Nontender with normal range of motion in all extremities.  Neurologic:  Normal speech and language. No gross focal neurologic deficits  Skin:  Skin is warm, dry and intact.  Psychiatric: Mood and affect are normal.  ____________________________________________    EKG  EKG reviewed and interpreted by  myself shows sinus rhythm at 84 bpm with a widened QRS, left axis deviation, morphology consistent with left bundle branch block.  Nonspecific ST changes.  Left bundle branch block present in prior EKG 01/13/14.  ____________________________________________   INITIAL IMPRESSION / ASSESSMENT AND PLAN / ED COURSE  Pertinent labs & imaging results that were available during my care of the patient were reviewed by me and considered in my medical decision making (see chart for details).  Patient presents to the emergency department for nausea vomiting diarrhea sent from his nursing facility.  Patient mental retardation a very poor historian.  Differential this time would include gastroenteritis, intra-abdominal pathology, less likely ACS, C. difficile infection.  We will begin our workup with lab work including abdominal labs as well as cardiac enzymes, EKG, IV fluids, sent for a stool panel as well as C. difficile testing.  We will place the patient on contact precautions until further information is known.  Currently the patient appears well, no distress, lying in bed comfortably, has no complaints. Blood pressure noted to be hypotensive we will continue with IV hydration and close monitoring.  Patient's labs are resulted largely within normal limits with a normal white blood  cell count.  Mild renal insufficiency creatinine 1.77 baseline around 1-1.3.  Troponin is slightly elevated 0.06.  No old troponin for comparison.  Stool studies and urine studies are pending.  However given the patient's initial hypotension in the 70s which has improved with IV fluids currently 116/60, will admit the patient to the hospitalist service for continued monitoring, continued workup and continued treatment.  ____________________________________________   FINAL CLINICAL IMPRESSION(S) / ED DIAGNOSES  Nausea vomiting diarrhea Hypotension   Minna AntisPaduchowski, Terrica Duecker, MD 01/17/18 1615

## 2018-01-17 NOTE — ED Notes (Signed)
Changed patient at this time from stool.  Pt noted to have mucous and bright red bleeding mixed in.  Previous BM's did not have any blood noted to them.  Dr. Lenard LancePaduchowski notified of this and Dr. Katheren ShamsSalary paged to notify at this time.

## 2018-01-18 DIAGNOSIS — K529 Noninfective gastroenteritis and colitis, unspecified: Secondary | ICD-10-CM | POA: Diagnosis not present

## 2018-01-18 DIAGNOSIS — K5289 Other specified noninfective gastroenteritis and colitis: Secondary | ICD-10-CM | POA: Diagnosis not present

## 2018-01-18 LAB — BASIC METABOLIC PANEL
Anion gap: 9 (ref 5–15)
BUN: 27 mg/dL — ABNORMAL HIGH (ref 6–20)
CHLORIDE: 107 mmol/L (ref 101–111)
CO2: 25 mmol/L (ref 22–32)
CREATININE: 1.35 mg/dL — AB (ref 0.61–1.24)
Calcium: 8.1 mg/dL — ABNORMAL LOW (ref 8.9–10.3)
GFR calc non Af Amer: 48 mL/min — ABNORMAL LOW (ref >60)
GFR, EST AFRICAN AMERICAN: 56 mL/min — AB (ref >60)
GLUCOSE: 97 mg/dL (ref 65–99)
Potassium: 3.7 mmol/L (ref 3.5–5.1)
Sodium: 141 mmol/L (ref 135–145)

## 2018-01-18 LAB — GLUCOSE, CAPILLARY
GLUCOSE-CAPILLARY: 102 mg/dL — AB (ref 65–99)
Glucose-Capillary: 83 mg/dL (ref 65–99)
Glucose-Capillary: 90 mg/dL (ref 65–99)
Glucose-Capillary: 105 mg/dL — ABNORMAL HIGH (ref 65–99)

## 2018-01-18 LAB — HEMOGLOBIN AND HEMATOCRIT, BLOOD
HCT: 34.8 % — ABNORMAL LOW (ref 40.0–52.0)
HEMATOCRIT: 34.4 % — AB (ref 40.0–52.0)
HEMATOCRIT: 33.6 % — AB (ref 40.0–52.0)
HEMOGLOBIN: 11.6 g/dL — AB (ref 13.0–18.0)
HEMOGLOBIN: 11.3 g/dL — AB (ref 13.0–18.0)
Hemoglobin: 11.7 g/dL — ABNORMAL LOW (ref 13.0–18.0)

## 2018-01-18 LAB — MRSA PCR SCREENING: MRSA BY PCR: NEGATIVE

## 2018-01-18 MED ORDER — TRAZODONE HCL 50 MG PO TABS
50.0000 mg | ORAL_TABLET | Freq: Every day | ORAL | Status: DC
  Administered 2018-01-18: 50 mg via ORAL
  Filled 2018-01-18: qty 1

## 2018-01-18 MED ORDER — ENSURE ENLIVE PO LIQD
237.0000 mL | Freq: Two times a day (BID) | ORAL | Status: DC
  Administered 2018-01-19 (×2): 237 mL via ORAL

## 2018-01-18 MED ORDER — ALPRAZOLAM 0.25 MG PO TABS: 0.2500 mg | ORAL_TABLET | Freq: Two times a day (BID) | ORAL | Status: DC | PRN

## 2018-01-18 NOTE — Progress Notes (Signed)
Initial Nutrition Assessment  DOCUMENTATION CODES:   Obesity unspecified  INTERVENTION:   Ensure Enlive po BID, each supplement provides 350 kcal and 20 grams of protein  Magic cup TID with meals, each supplement provides 290 kcal and 9 grams of protein  MVI daily   NUTRITION DIAGNOSIS:   Inadequate oral intake related to acute illness as evidenced by other (comment)(liquid diet since admit).  GOAL:   Patient will meet greater than or equal to 90% of their needs  MONITOR:   PO intake, Supplement acceptance, Weight trends, Labs, I & O's, Skin  REASON FOR ASSESSMENT:   Consult Assessment of nutrition requirement/status  ASSESSMENT:   82 year old male with mental retardation, low IQ, BPH, hypertension, CAD, GERD presents to hospital secondary to bloody diarrhea   Visited pt's room today. Unable to obtain nutrition related history as pt with mental retardation. Spoke to sitter at bedside; pt initiated on full liquids today, tolerating clears yesterday. There is no recent weight history in chart, but pt's weight appears to be stable from 2017. RD will order supplements to help pt meet his estimated protein needs.    Medications reviewed and include: celexa, heparin, insulin, MVI, protonix, NaCl @75ml /hr  Labs reviewed: BUN 27(H), creat 1.35(H), Ca 8.1(L)  Nutrition-Focused physical exam completed. Findings are no fat depletion, no muscle depletion, and no edema.   Diet Order:  Aspiration precautions Diet full liquid Room service appropriate? Yes; Fluid consistency: Thin  EDUCATION NEEDS:   Not appropriate for education at this time  Skin:  Reviewed RN Assessment  Last BM:  2/25- type 7  Height:   Ht Readings from Last 1 Encounters:  01/18/18 5\' 2"  (1.575 m)    Weight:   Wt Readings from Last 1 Encounters:  01/17/18 177 lb 11.1 oz (80.6 kg)    Ideal Body Weight:  50 kg  BMI:  Body mass index is 32.5 kg/m.  Estimated Nutritional Needs:   Kcal:   1500-1800kcal/day   Protein:  80-89g/day   Fluid:  >1.8L/day   Betsey Holidayasey Osmar Howton MS, RD, LDN Pager #[phone] After Hours Pager: [phone]

## 2018-01-18 NOTE — Progress Notes (Signed)
Physical Therapy Evaluation Patient Details Name: Ralph Boyd MRN: [id number] DOB: 11/20/1936 Today's Date: 01/18/2018   History of Present Illness  Pt arrived with complaints of emesis/diarrhea and recent falls. He was admitted for acute gastroenteritis, acute on chronic renal failure, hypotension, and leg wounds. PT consulted due to recent falls  Clinical Impression  Pt admitted with above diagnosis. Pt currently with functional limitations due to the deficits listed below (see PT Problem List).  Pt is modified independent for bed mobility and CGA for transfers and ambulation. He is able to ambulate to RN station and back to bed. He requires cues for sequencing with walker and initially upon standing attempts to walk away without using walker walker. VSS throughout ambulation on room air and no signs of respiratory distress. Pt instructed in safe use of walker. Needs reinforcement for proper use of walker. Pt is able to follow all commands for MMT and ambulation with therapist. He would benefit from [facility] PT to work on strength, balance, and conditioning to decrease fall risk. Pt will benefit from PT services to address deficits in strength, balance, and mobility in order to return to full function at home.     Follow Up Recommendations Home health PT;Other (comment)(Return to facility with [facility] PT for strength and balance)    Equipment Recommendations  Rolling walker with 5" wheels    Recommendations for Other Services       Precautions / Restrictions Precautions Precautions: Fall Restrictions Weight Bearing Restrictions: No      Mobility  Bed Mobility Overal bed mobility: Modified Independent             General bed mobility comments: Fair speed and sequencing  Transfers Overall transfer level: Needs assistance Equipment used: Rolling walker (2 wheeled) Transfers: Sit to/from Stand Sit to Stand: Min guard         General transfer comment: Pt requires cues for  safe hand placement. He is steady and stable in standing with bilateral UE support on rolling walker  Ambulation/Gait Ambulation/Gait assistance: Min guard Ambulation Distance (Feet): 80 Feet Assistive device: Rolling walker (2 wheeled) Gait Pattern/deviations: Decreased step length - right;Decreased step length - left Gait velocity: Decreased Gait velocity interpretation: <1.8 ft/sec, indicative of risk for recurrent falls General Gait Details: Pt able to ambualte to RN station and back to room. He requires cues for sequencing with walker and initially upon standing attemtps to walk away without grabbing walker. VSS throughout ambulation on room air and no signs of respiratory distress. Pt instructed in safe use of walker. Needs reinforcement  Stairs            Wheelchair Mobility    Modified Rankin (Stroke Patients Only)       Balance Overall balance assessment: Needs assistance Sitting-balance support: No upper extremity supported Sitting balance-Leahy Scale: Good     Standing balance support: No upper extremity supported Standing balance-Leahy Scale: Fair Standing balance comment: Able to balance for short bouts without UE support in standing                             Pertinent Vitals/Pain Pain Assessment: No/denies pain    Home Living Family/patient expects to be discharged to:: Assisted living               Home Equipment: None Additional Comments: Pt at [facility]    Prior Function Level of Independence: Needs assistance   Gait / Transfers  Assistance Needed: Ambulates in facility without assist device. Progressively less stable recently. 2 falls in the last 12 months  ADL's / Homemaking Assistance Needed: Requires assist for ADLs/IADLs        Hand Dominance   Dominant Hand: Right    Extremity/Trunk Assessment   Upper Extremity Assessment Upper Extremity Assessment: Overall WFL for tasks assessed    Lower Extremity  Assessment Lower Extremity Assessment: Overall WFL for tasks assessed       Communication   Communication: No difficulties  Cognition Arousal/Alertness: Awake/alert Behavior During Therapy: Flat affect Overall Cognitive Status: History of cognitive impairments - at baseline                                 General Comments: Pt is AOx1 currently and this is his baseline      General Comments      Exercises     Assessment/Plan    PT Assessment Patient needs continued PT services  PT Problem List Decreased strength;Decreased balance;Decreased mobility;Decreased cognition       PT Treatment Interventions DME instruction;Gait training;Functional mobility training;Therapeutic activities;Therapeutic exercise;Balance training;Neuromuscular re-education;Cognitive remediation;Patient/family education    PT Goals (Current goals can be found in the Care Plan section)  Acute Rehab PT Goals Patient Stated Goal: Improve balance and decrease falls PT Goal Formulation: With family Time For Goal Achievement: 02/01/18 Potential to Achieve Goals: Good    Frequency Min 2X/week   Barriers to discharge        Co-evaluation               AM-PAC PT "6 Clicks" Daily Activity  Outcome Measure Difficulty turning over in bed (including adjusting bedclothes, sheets and blankets)?: A Little Difficulty moving from lying on back to sitting on the side of the bed? : A Little Difficulty sitting down on and standing up from a chair with arms (e.g., wheelchair, bedside commode, etc,.)?: A Little Help needed moving to and from a bed to chair (including a wheelchair)?: A Little Help needed walking in hospital room?: A Little Help needed climbing 3-5 steps with a railing? : A Lot 6 Click Score: 17    End of Session Equipment Utilized During Treatment: Gait belt Activity Tolerance: Patient tolerated treatment well Patient left: in bed;with call bell/phone within reach;with bed  alarm set;with family/visitor present Nurse Communication: Mobility status PT Visit Diagnosis: Unsteadiness on feet (R26.81);Muscle weakness (generalized) (M62.81);History of falling (Z91.81);Repeated falls (R29.6)    Time: 1335-1400 PT Time Calculation (min) (ACUTE ONLY): 25 min   Charges:   PT Evaluation $PT Eval Low Complexity: 1 Low PT Treatments $Gait Training: 8-22 mins   PT G Codes:        Sharalyn InkJason D Alessa Mazur PT, DPT    Smokey Melott 01/18/2018, 4:09 PM

## 2018-01-18 NOTE — Care Management Obs Status (Signed)
MEDICARE OBSERVATION STATUS NOTIFICATION   Patient Details  Name: Ralph Boyd MRN: [id number] Date of Birth: 03/04/1936   Medicare Observation Status Notification Given:  Yes    Chapman FitchBOWEN, Buelah Rennie T, RN 01/18/2018, 3:25 PM

## 2018-01-18 NOTE — Care Management CC44 (Deleted)
Condition Code 44 Documentation Completed  Patient Details  Name: Ralph Boyd MRN: [id number] Date of Birth: 08/06/1936   Condition Code 44 given:    Patient signature on Condition Code 44 notice:    Documentation of 2 MD's agreement:    Code 44 added to claim:       Chapman FitchBOWEN, Idelle Reimann T, RN 01/18/2018, 2:18 PM

## 2018-01-18 NOTE — Plan of Care (Signed)
Pt is tolerating soft diet. ambulated with PT today

## 2018-01-18 NOTE — Progress Notes (Addendum)
[employer] - [place] at [facility]   PATIENT NAME: Ralph Boyd    MR#:  [id number]  DATE OF BIRTH:  Apr 04, 1936  SUBJECTIVE:  CHIEF COMPLAINT:   Chief Complaint  Patient presents with  . Emesis  . Diarrhea   - denies any complaints, sister at bedside - no further bloody stools this morning. Hemoglobin did drop since admission.  REVIEW OF SYSTEMS:  Review of Systems  Constitutional: Negative for chills, fever and malaise/fatigue.  HENT: Negative for congestion, ear discharge, hearing loss and nosebleeds.   Respiratory: Negative for cough, shortness of breath and wheezing.   Cardiovascular: Negative for chest pain and palpitations.  Gastrointestinal: Positive for blood in stool. Negative for abdominal pain, constipation, diarrhea, nausea and vomiting.  Genitourinary: Negative for dysuria.  Musculoskeletal: Positive for falls. Negative for myalgias.  Neurological: Negative for dizziness, speech change, focal weakness, seizures and headaches.  Psychiatric/Behavioral: Negative for depression.    DRUG ALLERGIES:  No Known Allergies  VITALS:  Blood pressure 112/65, pulse 76, temperature 98.7 F (37.1 C), temperature source Oral, resp. rate 16, weight 80.6 kg (177 lb 11.1 oz), SpO2 94 %.  PHYSICAL EXAMINATION:  Physical Exam  GENERAL:  82 y.o.-year-old patient lying in the bed with no acute distress.  EYES: Pupils equal, round, reactive to light and accommodation. No scleral icterus. Extraocular muscles intact.  HEENT: Head atraumatic, normocephalic. Oropharynx and nasopharynx clear.  NECK:  Supple, no jugular venous distention. No thyroid enlargement, no tenderness.  LUNGS: Normal breath sounds bilaterally, no wheezing, rales,rhonchi or crepitation. No use of accessory muscles of respiration.  CARDIOVASCULAR: S1, S2 normal. No murmurs, rubs, or gallops.  ABDOMEN: Soft, nontender, slightly distended. Bowel sounds present. No organomegaly or mass.    EXTREMITIES: No pedal edema, cyanosis, or clubbing.  NEUROLOGIC: Cranial nerves II through XII are intact. Muscle strength 5/5 in all extremities. Sensation intact. Gait not checked.  PSYCHIATRIC: The patient is alert and oriented to self, low IQ at baseline.  SKIN: No obvious rash, lesion, or ulcer.    LABORATORY PANEL:   CBC Recent Labs  Lab 01/17/18 1419  01/18/18 0144  WBC 8.1  --   --   HGB 14.2   < > 11.7*  HCT 42.0   < > 34.8*  PLT 284  --   --    < > = values in this interval not displayed.   ------------------------------------------------------------------------------------------------------------------  Chemistries  Recent Labs  Lab 01/17/18 1419 01/18/18 0144  NA 136 141  K 4.0 3.7  CL 100* 107  CO2 25 25  GLUCOSE 203* 97  BUN 37* 27*  CREATININE 1.77* 1.35*  CALCIUM 9.2 8.1*  AST 28  --   ALT 18  --   ALKPHOS 68  --   BILITOT 0.9  --    ------------------------------------------------------------------------------------------------------------------  Cardiac Enzymes Recent Labs  Lab 01/17/18 1419  TROPONINI 0.06*   ------------------------------------------------------------------------------------------------------------------  RADIOLOGY:  Dg Abd 2 Views  Result Date: 01/17/2018 CLINICAL DATA:  Abdominal distension with possible gastroenteritis. EXAM: ABDOMEN - 2 VIEW COMPARISON:  CT abdomen pelvis 01/13/2014 FINDINGS: Medium-sized hiatal hernia. No free intraperitoneal air. No dilated small bowel. No abnormal calcification or visible mass. IMPRESSION: 1. Medium-sized hiatal hernia. 2. No radiographic evidence of small-bowel obstruction. Electronically Signed   By: Deatra Robinson M.D.   On: 01/17/2018 19:38    EKG:   Orders placed or performed during the hospital encounter of 01/17/18  . EKG 12-Lead  . EKG 12-Lead  ASSESSMENT AND PLAN:   82 year old male with mental retardation, low IQ, BPH, hypertension, CAD, GERD presents to hospital  secondary to bloody diarrhea  1. Bloody diarrhea-likely acute gastroenteritis. -None now. C. difficile is negative and GI panel is negative -Supportive treatment. Hemoglobin did drop with dilution and blood loss. -No indication for transfusion at this time. -No antibiotics needed at this time. Hold aspirin and Plavix for now. -GI has been consulted. However less likely to get any procedures due to his age and also recent use of Plavix-yesterday last dose.  2. Acute on chronic renal failure-secondary to GI losses. -Baseline creatinine around 1.1. Continue IV fluids. Improvement noted.  3. Hypertension-hypovolemic. Hold antihypertensives and diuretics for now. Continue IV fluids and monitor  4. Chronic developmental disability-seems to be at baseline  5. CAD-stable. Follows with cardiology. No recent stents. Continue cardiac medications. Except aspirin and Plavix are on hold.  6. BPH-continue home medications  7. Depression and anxiety-on Celexa. Sister requesting for anxiety medication. Added Xanax when necessary and trazodone at bedtime for sleep  8. DVT prophylaxis-Ted's and SCDs. SQ heparin, no active bleeding now.  Physical therapy consult requested. Patient ambulates independently at baseline. However has been having some falls lately. Sister updated at bedside   All the records are reviewed and case discussed with Care Management/Social Workerr. Management plans discussed with the patient, family and they are in agreement.  CODE STATUS: DNR  TOTAL TIME TAKING CARE OF THIS PATIENT: 38 minutes.   POSSIBLE D/C IN 1-2 DAYS, DEPENDING ON CLINICAL CONDITION.   Enid BaasKALISETTI,Yarelie Hams M.D on 01/18/2018 at 9:19 AM  Between 7am to 6pm - Pager - [phone]  After 6pm go to [email or web address] - Social research officer, governmentpassword [email or web address] [email or web address]  [facility]  Office  [phone]  CC: Primary care physician; [facility]

## 2018-01-18 NOTE — Care Management Obs Status (Deleted)
MEDICARE OBSERVATION STATUS NOTIFICATION   Patient Details  Name: Ralph Boyd MRN: [id number] Date of Birth: 07/13/1936   Medicare Observation Status Notification Given:       Chapman FitchBOWEN, Bernice Mcauliffe T, RN 01/18/2018, 2:18 PM

## 2018-01-18 NOTE — Clinical Social Work Note (Signed)
Clinical Social Work Assessment  Patient Details  Name: Ralph Boyd MRN: [id number] Date of Birth: 05/06/1936  Date of referral:  01/18/18               Reason for consult:  Discharge Planning                Permission sought to share information with:    Permission granted to share information::     Name::        Agency::     Relationship::     Contact Information:     Housing/Transportation Living arrangements for the past 2 months:  Assisted Living Facility Source of Information:  Facility, Guardian Patient Interpreter Needed:  None Criminal Activity/Legal Involvement Pertinent to Current Situation/Hospitalization:  No - Comment as needed Significant Relationships:  Siblings Lives with:  Facility Resident Do you feel safe going back to the place where you live?  Yes Need for family participation in patient care:  Yes (Comment)  Care giving concerns:  Patient resides as a long term resident at [facility].   Social Worker assessment / plan:  Patient's guardian is his sister: Ralph Boyd: [phone]. CSW spoke with [name] earlier today along with the unit secretary to ensure patient's guardianship papers were placed on the patient's chart. CSW spoke with Hospital doctorAmber at [facility] and she states that patient is able to return to their facility when time.   Employment status:  Disabled (Comment on whether or not currently receiving Disability) Insurance information:  Medicare PT Recommendations:    Information / Referral to community resources:     Patient/Family's Response to care:  Patient is calm.  Patient/Family's Understanding of and Emotional Response to Diagnosis, Current Treatment, and Prognosis:  Patient's sister is involved with the treatment plan.  Emotional Assessment Appearance:    Attitude/Demeanor/Rapport:  Unable to Assess Affect (typically observed):  Calm Orientation:  Oriented to Self Alcohol / Substance use:  Not Applicable Psych involvement  (Current and /or in the community):  No (Comment)  Discharge Needs  Concerns to be addressed:  Care Coordination Readmission within the last 30 days:  No Current discharge risk:  None Barriers to Discharge:  No Barriers Identified   York SpanielMonica Ralph Fassnacht, LCSW 01/18/2018, 11:47 AM

## 2018-01-18 NOTE — Consult Note (Signed)
Wyline MoodKiran Kenyanna Grzesiak , MD [address], [address], [place], [place], [address] [address], [place], [place], [address] Phone: [phone]  Fax: [phone]  Consultation  Referring Provider:    Dr Nemiah CommanderKalisetti Primary Care Physician:  [facility] Primary Gastroenterologist:  Dr. Marva Pandaskulskie       Reason for Consultation:     GI bleed  Date of Admission:  01/17/2018 Date of Consultation:  01/18/2018         HPI:   Ralph Boyd is a 82 y.o. male with a history of dementia , known issue with bowel incontinence per [facility] 11/2017 . Admitted 01/17/18 nausea,vomiting and diarrhea. Was hypotensive. Hb on admision was 14 grams, elevated creatinine at 1.77, troponin at 0.06. C diff ,GI PCR negative,.  I have been called to see the patient as some blood was seen on the stool this morning and none since. On plavix. Patient himself cannot provide any history but appears comfrotable. Spoke to nurse taking care, only 1 isolated bowel movement with some blood. Subsequent one had none.   Past Medical History:  Diagnosis Date  . Acid reflux   . Anxiety   . Asthma   . Atrial fibrillation (HCC)   . BPH (benign prostatic hyperplasia)   . Heart disease   . HLD (hyperlipidemia)   . HTN (hypertension)   . Kidney disease   . MI (myocardial infarction) [facility](HCC)     Past Surgical History:  Procedure Laterality Date  . HERNIA REPAIR      Prior to Admission medications   Medication Sig Start Date End Date Taking? Authorizing Provider  acetaminophen (TYLENOL) 325 MG tablet Take 650 mg by mouth every 6 (six) hours as needed.   Yes [provider]  amLODipine (NORVASC) 5 MG tablet TAKE 1 TABLET BY MOUTH  DAILY. 02/12/15  Yes [provider]  aspirin 81 MG chewable tablet CHEW AND SWALLOW ONE TABLET BY MOUTH ONCE DAILY. 02/12/15  Yes [provider]  citalopram (CELEXA) 20 MG tablet TAKE 1 TABLET BY MOUTH ONCE DAILY. 02/12/15  Yes  [provider]  clopidogrel (PLAVIX) 75 MG tablet TAKE 1 TABLET BY MOUTH ONCE DAILY. 02/12/15  Yes [provider]  finasteride (PROSCAR) 5 MG tablet TAKE 1 TABLET BY MOUTH DAILY. 02/12/15  Yes [provider]  fluticasone-salmeterol (ADVAIR HFA) 45-21 MCG/ACT inhaler INHALE 2 PUFFS BY MOUTH TWICE DAILY. **RINSE MOUTH AFTER EACH USE** 02/12/15  Yes [provider]  guaiFENesin (ROBITUSSIN) 100 MG/5ML liquid Take 300 mg by mouth 3 (three) times daily as needed for cough.   Yes [provider]  isosorbide mononitrate (IMDUR) 60 MG 24 hr tablet TAKE 1 TABLET BY MOUTH TWICE DAILY. 02/12/15  Yes [provider]  lisinopril (PRINIVIL,ZESTRIL) 10 MG tablet Take 10 mg by mouth daily.    Yes [provider]  loratadine (CLARITIN) 10 MG tablet Take 10 mg by mouth daily.   Yes [provider]  mineral oil external liquid 1 application by Does not apply route daily as needed.   Yes [provider]  pantoprazole (PROTONIX) 40 MG tablet Take 40 mg by mouth 2 (two) times daily.   Yes [provider]  polyethylene glycol (MIRALAX / GLYCOLAX) packet Take 17 g by mouth daily.   Yes [provider]  psyllium (METAMUCIL) 0.52 g capsule Take 0.52 g by mouth 2 (two) times daily. Reported on 04/16/2016   Yes [provider]  ranolazine (  RANEXA) 500 MG 12 hr tablet Take 500 mg by mouth 2 (two) times daily.   Yes [provider]  Skin Protectants, Misc. (HYDROCERIN EX) Apply 1 application topically 2 (two) times daily.   Yes [provider]  tamsulosin (FLOMAX) 0.4 MG CAPS capsule Take 1 capsule (0.4 mg total) by mouth daily. 07/03/15  Yes Crist Fat, MD  albuterol [facility] HFA) 108 (90 BASE) MCG/ACT inhaler Inhale 2 puffs into the lungs every 6 (six) hours as needed.  12/28/14 12/28/15  [provider]  furosemide (LASIX) 20 MG tablet Take 40 mg by mouth 2 (two) times daily. Reported on  04/16/2016 12/17/15 12/16/16  [provider]  loperamide (IMODIUM A-D) 2 MG tablet Take 4 mg by mouth as needed.     [provider]  nitroGLYCERIN (NITROSTAT) 0.4 MG SL tablet Place one tablet under tongue as needed.    [provider]    History reviewed. No pertinent family history.   Social History   Tobacco Use  . Smoking status: Former Games developer  . Smokeless tobacco: Never Used  . Tobacco comment: Quit 6 years  Substance Use Topics  . Alcohol use: No    Alcohol/week: 0.0 oz  . Drug use: No    Allergies as of 01/17/2018  . (No Known Allergies)    Review of Systems:    Cannot obtain ROS   Physical Exam:  Vital signs in last 24 hours: Temp:  [97.5 F (36.4 C)-98.7 F (37.1 C)] 98.7 F (37.1 C) (02/25 0521) Pulse Rate:  [76-103] 76 (02/25 0521) Resp:  [16-38] 16 (02/25 0521) BP: (80-149)/(46-77) 112/65 (02/25 0521) SpO2:  [94 %-97 %] 94 % (02/25 0521) Weight:  [177 lb 11.1 oz (80.6 kg)-181 lb (82.1 kg)] 177 lb 11.1 oz (80.6 kg) (02/24 1813) Last BM Date: 01/17/18 General:   Pleasant, cooperative in NAD Head:  Normocephalic and atraumatic. Eyes:   No icterus.   Conjunctiva pink. PERRLA. Ears:  Normal auditory acuity. Neck:  Supple; no masses or thyroidomegaly Lungs: Respirations even and unlabored. Lungs clear to auscultation bilaterally.   No wheezes, crackles, or rhonchi.  Heart:  Regular rate and rhythm;  Without murmur, clicks, rubs or gallops Abdomen:  Soft, mild distension , nontender. Normal bowel sounds. No appreciable masses or hepatomegaly.  No rebound or guarding.  Neurologic:  Alert and oriented x1 Skin:  Intact without significant lesions or rashes. Cervical Nodes:  No significant cervical adenopathy. Psych:  Alert and cooperative.   LAB RESULTS: Recent Labs    01/17/18 1419 01/17/18 1840 01/18/18 0144  WBC 8.1  --   --   HGB 14.2 13.6 11.7*  HCT 42.0 40.1 34.8*  PLT 284  --   --    BMET Recent Labs    01/17/18 1419  01/18/18 0144  NA 136 141  K 4.0 3.7  CL 100* 107  CO2 25 25  GLUCOSE 203* 97  BUN 37* 27*  CREATININE 1.77* 1.35*  CALCIUM 9.2 8.1*   LFT Recent Labs    01/17/18 1419  PROT 7.4  ALBUMIN 3.5  AST 28  ALT 18  ALKPHOS 68  BILITOT 0.9   PT/INR No results for input(s): LABPROT, INR in the last 72 hours.  STUDIES: Dg Abd 2 Views  Result Date: 01/17/2018 CLINICAL DATA:  Abdominal distension with possible gastroenteritis. EXAM: ABDOMEN - 2 VIEW COMPARISON:  CT abdomen pelvis 01/13/2014 FINDINGS: Medium-sized hiatal hernia. No free intraperitoneal air. No dilated small bowel. No abnormal calcification  or visible mass. IMPRESSION: 1. Medium-sized hiatal hernia. 2. No radiographic evidence of small-bowel obstruction. Electronically Signed   By: Deatra Robinson M.D.   On: 01/17/2018 19:38      Impression / Plan:   Ralph Boyd is a 82 y.o. y/o male on plavix admitted with acute history of nausea,vomiting and diarrhea. Some blood since on one bowel movement that has stopped. Admittedw ith AKI: History suggestive of gastrioenteritis. The drop in Hb likely from IV fluid rehydration and likely will drop further as AKI resolves.   Plan  1. Rehydrate, when wishes to take orally start on clears and advance 2. Suggest against any antibiotics as occasionally it can lead to HUS-TTP if infected with E coli 0157. Most gastroentreritis resolves with supportive care. Would not recommend any endoscopy at this time if stable. Can be discussed further with DR Marva Panda outpatient based on patients long terms goals of care.    I will sign off.  Please call me if any further GI concerns or questions.  We would like to thank you for the opportunity to participate in the care of Ralph Boyd [facility].   Thank you for involving me in the care of this patient.      LOS: 0 days   Wyline Mood, MD  01/18/2018, 8:47 AM

## 2018-01-18 NOTE — Progress Notes (Signed)
   [employer] - [place] at [facility]   Advance care planning  Hospital Day: 0 days Ralph NorfolkJames Bruni is a 82 y.o. male presenting with Emesis and Diarrhea .   Advance care planning discussed with patient and his sister at bedside. All questions in regards to overall condition and expected prognosis answered. The decision was made to continue current code status  CODE STATUS: DNR Time spent: 18 minutes

## 2018-01-19 DIAGNOSIS — K5289 Other specified noninfective gastroenteritis and colitis: Secondary | ICD-10-CM | POA: Diagnosis not present

## 2018-01-19 LAB — GLUCOSE, CAPILLARY
Glucose-Capillary: 76 mg/dL (ref 65–99)
Glucose-Capillary: 173 mg/dL — ABNORMAL HIGH (ref 65–99)

## 2018-01-19 LAB — HEMOGLOBIN AND HEMATOCRIT, BLOOD
HCT: 33.6 % — ABNORMAL LOW (ref 40.0–52.0)
HEMATOCRIT: 32.1 % — AB (ref 40.0–52.0)
HEMOGLOBIN: 10.8 g/dL — AB (ref 13.0–18.0)
Hemoglobin: 11.4 g/dL — ABNORMAL LOW (ref 13.0–18.0)

## 2018-01-19 LAB — HEMOGLOBIN A1C
Hgb A1c MFr Bld: 6 % — ABNORMAL HIGH (ref 4.8–5.6)
MEAN PLASMA GLUCOSE: 126 mg/dL

## 2018-01-19 MED ORDER — TRAZODONE HCL 50 MG PO TABS: 50.0000 mg | ORAL_TABLET | Freq: Every day | ORAL | 2 refills | Status: DC

## 2018-01-19 MED ORDER — POLYETHYLENE GLYCOL 3350 17 G PO PACK: 17.0000 g | PACK | Freq: Every day | ORAL | 0 refills | Status: DC | PRN

## 2018-01-19 NOTE — [place] FL2 (Signed)
[place] MEDICAID FL2 LEVEL OF CARE SCREENING TOOL     IDENTIFICATION  Patient Name: Elmus Mathes [facility] Birthdate: 1936-11-01 Sex: male Admission Date (Current Location): 01/17/2018  [place] and [place] Number:  Chiropodist and Address:  [facility], [address], [place], [place] [address]      Provider Number: [id number]  Attending Physician Name and Address:  Enid Baas, MD  Relative Name and Phone Number:       Current Level of Care: Hospital Recommended Level of Care: Assisted Living Facility Prior Approval Number:    Date Approved/Denied:   PASRR Number:    Discharge Plan: (ALF)    Current Diagnoses: Patient Active Problem List   Diagnosis Date Noted  . Acute gastroenteritis 01/17/2018  . Arteriosclerosis of coronary artery 07/27/2015  . Diabetes mellitus, type 2 (HCC) 07/27/2015  . Hypercholesteremia 07/27/2015  . BP (high blood pressure) 07/27/2015  . Intellectual disability 07/13/2015  . COPD, moderate (HCC) 07/28/2014    Orientation RESPIRATION BLADDER Height & Weight     Self, Place  Normal Incontinent Weight: 177 lb 11.1 oz (80.6 kg) Height:  5\' 8"  (172.7 cm)  BEHAVIORAL SYMPTOMS/MOOD NEUROLOGICAL BOWEL NUTRITION STATUS  (none) (none) Continent Diet(soft)  AMBULATORY STATUS COMMUNICATION OF NEEDS Skin   Limited Assist Verbally Normal                       Personal Care Assistance Level of Assistance  Dressing, Feeding, Bathing Bathing Assistance: Limited assistance Feeding assistance: Limited assistance Dressing Assistance: Limited assistance     Functional Limitations Info             SPECIAL CARE FACTORS FREQUENCY   Home health                    Contractures Contractures Info: Not present    Additional Factors Info  Code Status Code Status Info: dnr               No Known Allergies DISCHARGE MEDICATIONS:   Allergies as of 01/19/2018   No Known Allergies           Medication List     STOP taking these medications   amLODipine 5 MG tablet Commonly known as:  NORVASC   clopidogrel 75 MG tablet Commonly known as:  PLAVIX   furosemide 20 MG tablet Commonly known as:  LASIX   lisinopril 10 MG tablet Commonly known as:  PRINIVIL,ZESTRIL   METAMUCIL 0.52 g capsule Generic drug:  psyllium   PROAIR HFA 108 (90 Base) MCG/ACT inhaler Generic drug:  albuterol     TAKE these medications   acetaminophen 325 MG tablet Commonly known as:  TYLENOL Take 650 mg by mouth every 6 (six) hours as needed.   ADVAIR HFA 45-21 MCG/ACT inhaler Generic drug:  fluticasone-salmeterol INHALE 2 PUFFS BY MOUTH TWICE DAILY. **RINSE MOUTH AFTER EACH USE**   aspirin 81 MG chewable tablet CHEW AND SWALLOW ONE TABLET BY MOUTH ONCE DAILY.   citalopram 20 MG tablet Commonly known as:  CELEXA TAKE 1 TABLET BY MOUTH ONCE DAILY.   finasteride 5 MG tablet Commonly known as:  PROSCAR TAKE 1 TABLET BY MOUTH DAILY.   guaiFENesin 100 MG/5ML liquid Commonly known as:  ROBITUSSIN Take 300 mg by mouth 3 (three) times daily as needed for cough.   HYDROCERIN EX Apply 1 application topically 2 (two) times daily.   isosorbide mononitrate 60 MG 24 hr tablet Commonly known  as:  IMDUR TAKE 1 TABLET BY MOUTH TWICE DAILY.   loperamide 2 MG tablet Commonly known as:  IMODIUM A-D Take 4 mg by mouth as needed.   loratadine 10 MG tablet Commonly known as:  CLARITIN Take 10 mg by mouth daily.   mineral oil external liquid 1 application by Does not apply route daily as needed.   nitroGLYCERIN 0.4 MG SL tablet Commonly known as:  NITROSTAT Place one tablet under tongue as needed.   pantoprazole 40 MG tablet Commonly known as:  PROTONIX Take 40 mg by mouth 2 (two) times daily.   polyethylene glycol packet Commonly known as:  MIRALAX / GLYCOLAX Take 17 g by mouth daily as needed for mild constipation. What changed:    when to take  this  reasons to take this   ranolazine 500 MG 12 hr tablet Commonly known as:  RANEXA Take 500 mg by mouth 2 (two) times daily.   tamsulosin 0.4 MG Caps capsule Commonly known as:  FLOMAX Take 1 capsule (0.4 mg total) by mouth daily.   traZODone 50 MG tablet Commonly known as:  DESYREL Take 1 tablet (50 mg total) by mouth at bedtime.           York SpanielMonica Michaeal Davis, [place]

## 2018-01-19 NOTE — Discharge Summary (Signed)
[facility] [place] [facility]   PATIENT NAME: Ralph Boyd    MR#:  [id number]  DATE OF BIRTH:  1936-04-22  DATE OF ADMISSION:  01/17/2018   ADMITTING PHYSICIAN: Bertrum Sol, MD  DATE OF DISCHARGE:  01/19/2018  PRIMARY CARE PHYSICIAN: [facility]   ADMISSION DIAGNOSIS:   Dehydration [E86.0] Gastroenteritis [K52.9] Hypotension, unspecified hypotension type [I95.9]  DISCHARGE DIAGNOSIS:   Active Problems:   Acute gastroenteritis   SECONDARY DIAGNOSIS:   Past Medical History:  Diagnosis Date  . Acid reflux   . Anxiety   . Asthma   . Atrial fibrillation (HCC)   . BPH (benign prostatic hyperplasia)   . Heart disease   . HLD (hyperlipidemia)   . HTN (hypertension)   . Kidney disease   . MI (myocardial infarction) [facility])     HOSPITAL COURSE:   82 year old male with mental retardation, low IQ, BPH, hypertension, CAD, GERD presents to hospital secondary to bloody diarrhea  1. Bloody diarrhea-likely acute gastroenteritis. -None now. C. difficile is negative and GI panel is negative -Supportive treatment. Hemoglobin did drop with dilution and blood loss. -No indication for transfusion at this time. -No antibiotics needed at this time. Hold  Plavix at discharge. - aspirin can be restarted -GI has been consulted.  No indication for any procedures -Laxatives have been changed to when necessary  2. Acute on chronic renal failure-secondary to GI losses. -Baseline creatinine around 1.1. received IV fluids. Improvement noted.  3. Hypertension-hypovolemic. Improved now, continue Imdur at discharge. Hold Norvasc and lisinopril  4. Chronic developmental disability-seems to be at baseline  5. CAD-stable. Follows with cardiology. No recent stents. Continue cardiac medications. Hold Plavix at discharge. Continue aspirin  6. BPH-continue home medications  7. Depression and anxiety-on Celexa. Sister requesting for anxiety  medication. Added trazodone at bedtime for sleep- it helped   Physical therapy consult requested. Patient ambulates independently at baseline. However has been having some falls lately. Sister updated at bedside Will be discharged back to [facility] with home health.    DISCHARGE CONDITIONS:   Guarded  CONSULTS OBTAINED:    GI consult by Dr. Tobi Bastos  DRUG ALLERGIES:   No Known Allergies DISCHARGE MEDICATIONS:   Allergies as of 01/19/2018   No Known Allergies     Medication List    STOP taking these medications   amLODipine 5 MG tablet Commonly known as:  NORVASC   clopidogrel 75 MG tablet Commonly known as:  PLAVIX   furosemide 20 MG tablet Commonly known as:  LASIX   lisinopril 10 MG tablet Commonly known as:  PRINIVIL,ZESTRIL   METAMUCIL 0.52 g capsule Generic drug:  psyllium   PROAIR HFA 108 (90 Base) MCG/ACT inhaler Generic drug:  albuterol     TAKE these medications   acetaminophen 325 MG tablet Commonly known as:  TYLENOL Take 650 mg by mouth every 6 (six) hours as needed.   ADVAIR HFA 45-21 MCG/ACT inhaler Generic drug:  fluticasone-salmeterol INHALE 2 PUFFS BY MOUTH TWICE DAILY. **RINSE MOUTH AFTER EACH USE**   aspirin 81 MG chewable tablet CHEW AND SWALLOW ONE TABLET BY MOUTH ONCE DAILY.   citalopram 20 MG tablet Commonly known as:  CELEXA TAKE 1 TABLET BY MOUTH ONCE DAILY.   finasteride 5 MG tablet Commonly known as:  PROSCAR TAKE 1 TABLET BY MOUTH DAILY.   guaiFENesin 100 MG/5ML liquid Commonly known as:  ROBITUSSIN Take 300 mg by mouth 3 (three) times daily as needed  for cough.   HYDROCERIN EX Apply 1 application topically 2 (two) times daily.   isosorbide mononitrate 60 MG 24 hr tablet Commonly known as:  IMDUR TAKE 1 TABLET BY MOUTH TWICE DAILY.   loperamide 2 MG tablet Commonly known as:  IMODIUM A-D Take 4 mg by mouth as needed.   loratadine 10 MG tablet Commonly known as:  CLARITIN Take 10 mg by mouth  daily.   mineral oil external liquid 1 application by Does not apply route daily as needed.   nitroGLYCERIN 0.4 MG SL tablet Commonly known as:  NITROSTAT Place one tablet under tongue as needed.   pantoprazole 40 MG tablet Commonly known as:  PROTONIX Take 40 mg by mouth 2 (two) times daily.   polyethylene glycol packet Commonly known as:  MIRALAX / GLYCOLAX Take 17 g by mouth daily as needed for mild constipation. What changed:    when to take this  reasons to take this   ranolazine 500 MG 12 hr tablet Commonly known as:  RANEXA Take 500 mg by mouth 2 (two) times daily.   tamsulosin 0.4 MG Caps capsule Commonly known as:  FLOMAX Take 1 capsule (0.4 mg total) by mouth daily.   traZODone 50 MG tablet Commonly known as:  DESYREL Take 1 tablet (50 mg total) by mouth at bedtime.        DISCHARGE INSTRUCTIONS:   1. PCP f/u in 1 week 2. Please use moisturizer to lower extremities to prevent from dryness  DIET:   Cardiac diet  ACTIVITY:   Activity as tolerated  OXYGEN:   Home Oxygen: No.  Oxygen Delivery: room air  DISCHARGE LOCATION:   home   If you experience worsening of your admission symptoms, develop shortness of breath, life threatening emergency, suicidal or homicidal thoughts you must seek medical attention immediately by calling 911 or calling your MD immediately  if symptoms less severe.  You Must read complete instructions/literature along with all the possible adverse reactions/side effects for all the Medicines you take and that have been prescribed to you. Take any new Medicines after you have completely understood and accpet all the possible adverse reactions/side effects.   Please note  You were cared for by a hospitalist during your hospital stay. If you have any questions about your discharge medications or the care you received while you were in the hospital after you are discharged, you can call the unit and asked to speak with the  hospitalist on call if the hospitalist that took care of you is not available. Once you are discharged, your primary care physician will handle any further medical issues. Please note that NO REFILLS for any discharge medications will be authorized once you are discharged, as it is imperative that you return to your primary care physician (or establish a relationship with a primary care physician if you do not have one) for your aftercare needs so that they can reassess your need for medications and monitor your lab values.    On the day of Discharge:  VITAL SIGNS:   Blood pressure (!) 111/48, pulse 72, temperature 98.4 F (36.9 C), temperature source Oral, resp. rate 20, height 5\' 8"  (1.727 m), weight 80.6 kg (177 lb 11.1 oz), SpO2 96 %.  PHYSICAL EXAMINATION:    GENERAL:  82 y.o.-year-old patient lying in the bed with no acute distress.  EYES: Pupils equal, round, reactive to light and accommodation. No scleral icterus. Extraocular muscles intact.  HEENT: Head atraumatic, normocephalic. Oropharynx and nasopharynx  clear.  NECK:  Supple, no jugular venous distention. No thyroid enlargement, no tenderness.  LUNGS: Normal breath sounds bilaterally, no wheezing, rales,rhonchi or crepitation. No use of accessory muscles of respiration.  CARDIOVASCULAR: S1, S2 normal. No murmurs, rubs, or gallops.  ABDOMEN: Soft, nontender, slightly distended. Bowel sounds present. No organomegaly or mass.  EXTREMITIES: No pedal edema, cyanosis, or clubbing. Scratch marks on the legs NEUROLOGIC: Cranial nerves II through XII are intact. Muscle strength 5/5 in all extremities. Sensation intact. Gait not checked.  PSYCHIATRIC: The patient is alert and oriented to self, low IQ at baseline.  SKIN: No obvious rash, lesion, or ulcer.    DATA REVIEW:   CBC Recent Labs  Lab 01/17/18 1419  01/19/18 0907  WBC 8.1  --   --   HGB 14.2   < > 11.4*  HCT 42.0   < > 33.6*  PLT 284  --   --    < > = values in this  interval not displayed.    Chemistries  Recent Labs  Lab 01/17/18 1419 01/18/18 0144  NA 136 141  K 4.0 3.7  CL 100* 107  CO2 25 25  GLUCOSE 203* 97  BUN 37* 27*  CREATININE 1.77* 1.35*  CALCIUM 9.2 8.1*  AST 28  --   ALT 18  --   ALKPHOS 68  --   BILITOT 0.9  --      Microbiology Results  Results for orders placed or performed during the hospital encounter of 01/17/18  C difficile quick scan w PCR reflex     Status: None   Collection Time: 01/17/18  3:05 PM  Result Value Ref Range Status   C Diff antigen NEGATIVE NEGATIVE Final   C Diff toxin NEGATIVE NEGATIVE Final   C Diff interpretation No C. difficile detected.  Final    Comment: VALID Performed at [facility], [address] [place], [place] [address]   Gastrointestinal Panel by PCR , Stool     Status: None   Collection Time: 01/17/18  3:05 PM  Result Value Ref Range Status   Campylobacter species NOT DETECTED NOT DETECTED Final   Plesimonas shigelloides NOT DETECTED NOT DETECTED Final   Salmonella species NOT DETECTED NOT DETECTED Final   Yersinia enterocolitica NOT DETECTED NOT DETECTED Final   Vibrio species NOT DETECTED NOT DETECTED Final   Vibrio cholerae NOT DETECTED NOT DETECTED Final   Enteroaggregative E coli (EAEC) NOT DETECTED NOT DETECTED Final   Enteropathogenic E coli (EPEC) NOT DETECTED NOT DETECTED Final   Enterotoxigenic E coli (ETEC) NOT DETECTED NOT DETECTED Final   Shiga like toxin producing E coli (STEC) NOT DETECTED NOT DETECTED Final   Shigella/Enteroinvasive E coli (EIEC) NOT DETECTED NOT DETECTED Final   Cryptosporidium NOT DETECTED NOT DETECTED Final   Cyclospora cayetanensis NOT DETECTED NOT DETECTED Final   Entamoeba histolytica NOT DETECTED NOT DETECTED Final   Giardia lamblia NOT DETECTED NOT DETECTED Final   Adenovirus F40/41 NOT DETECTED NOT DETECTED Final   Astrovirus NOT DETECTED NOT DETECTED Final   Norovirus GI/GII NOT DETECTED NOT DETECTED Final   Rotavirus  A NOT DETECTED NOT DETECTED Final   Sapovirus (I, II, IV, and V) NOT DETECTED NOT DETECTED Final    Comment: Performed at [facility], [address] [place], [place] [address]  MRSA PCR Screening     Status: None   Collection Time: 01/18/18  8:35 PM  Result Value Ref Range Status   MRSA by  PCR NEGATIVE NEGATIVE Final    Comment:        The GeneXpert MRSA Assay (FDA approved for NASAL specimens only), is one component of a comprehensive MRSA colonization surveillance program. It is not intended to diagnose MRSA infection nor to guide or monitor treatment for MRSA infections. Performed at [facility], [address] [place], [place] [address]     RADIOLOGY:  No results found.   Management plans discussed with the patient, family and they are in agreement.  CODE STATUS:     Code Status Orders  (From admission, onward)        Start     Ordered   01/17/18 1753  Do not attempt resuscitation (DNR)  Continuous    Question Answer Comment  In the event of cardiac or respiratory ARREST Do not call a "code blue"   In the event of cardiac or respiratory ARREST Do not perform Intubation, CPR, defibrillation or ACLS   In the event of cardiac or respiratory ARREST Use medication by any route, position, wound care, and other measures to relive pain and suffering. May use oxygen, suction and manual treatment of airway obstruction as needed for comfort.      01/17/18 1752    Code Status History    Date Active Date Inactive Code Status Order ID Comments User Context   This patient has a current code status but no historical code status.    Advance Directive Documentation     Most Recent Value  Type of Advance Directive  Out of facility DNR (pink MOST or yellow form)  Pre-existing out of facility DNR order (yellow form or pink MOST form)  Yellow form placed in chart (order not valid for inpatient use)  "MOST" Form in Place?  No data      TOTAL TIME TAKING  CARE OF THIS PATIENT: 38 minutes.    Enid BaasKALISETTI,Tyteanna Ost M.D on 01/19/2018 at 11:22 AM  Between 7am to 6pm - Pager - [phone]  After 6pm go to [email or web address] - Social research officer, governmentpassword [email or web address]  [facility] [place] [facility]  Office  [phone]  CC: Primary care physician; [facility]   Note: This dictation was prepared with Dragon dictation along with smaller phrase technology. Any transcriptional errors that result from this process are unintentional.

## 2018-01-19 NOTE — Progress Notes (Signed)
Quillian QuinceJames Ernest Slyter  A and O x 2. VSS. Pt tolerating diet well. No complaints of pain or nausea. IV removed intact, prescriptions given to legal guardian. She voiced understanding of discharge instructions with no further questions. [facility]. Pt legal guardian [facility].     Allergies as of 01/19/2018   No Known Allergies     Medication List    STOP taking these medications   amLODipine 5 MG tablet Commonly known as:  NORVASC   clopidogrel 75 MG tablet Commonly known as:  PLAVIX   furosemide 20 MG tablet Commonly known as:  LASIX   lisinopril 10 MG tablet Commonly known as:  PRINIVIL,ZESTRIL   METAMUCIL 0.52 g capsule Generic drug:  psyllium   PROAIR HFA 108 (90 Base) MCG/ACT inhaler Generic drug:  albuterol     TAKE these medications   acetaminophen 325 MG tablet Commonly known as:  TYLENOL Take 650 mg by mouth every 6 (six) hours as needed.   ADVAIR HFA 45-21 MCG/ACT inhaler Generic drug:  fluticasone-salmeterol INHALE 2 PUFFS BY MOUTH TWICE DAILY. **RINSE MOUTH AFTER EACH USE**   aspirin 81 MG chewable tablet CHEW AND SWALLOW ONE TABLET BY MOUTH ONCE DAILY.   citalopram 20 MG tablet Commonly known as:  CELEXA TAKE 1 TABLET BY MOUTH ONCE DAILY.   finasteride 5 MG tablet Commonly known as:  PROSCAR TAKE 1 TABLET BY MOUTH DAILY.   guaiFENesin 100 MG/5ML liquid Commonly known as:  ROBITUSSIN Take 300 mg by mouth 3 (three) times daily as needed for cough.   HYDROCERIN EX Apply 1 application topically 2 (two) times daily.   isosorbide mononitrate 60 MG 24 hr tablet Commonly known as:  IMDUR TAKE 1 TABLET BY MOUTH TWICE DAILY.   loperamide 2 MG tablet Commonly known as:  IMODIUM A-D Take 4 mg by mouth as needed.   loratadine 10 MG tablet Commonly known as:  CLARITIN Take 10 mg by mouth daily.   mineral oil external liquid 1 application by Does not apply route daily as  needed.   nitroGLYCERIN 0.4 MG SL tablet Commonly known as:  NITROSTAT Place one tablet under tongue as needed.   pantoprazole 40 MG tablet Commonly known as:  PROTONIX Take 40 mg by mouth 2 (two) times daily.   polyethylene glycol packet Commonly known as:  MIRALAX / GLYCOLAX Take 17 g by mouth daily as needed for mild constipation. What changed:    when to take this  reasons to take this   ranolazine 500 MG 12 hr tablet Commonly known as:  RANEXA Take 500 mg by mouth 2 (two) times daily.   tamsulosin 0.4 MG Caps capsule Commonly known as:  FLOMAX Take 1 capsule (0.4 mg total) by mouth daily.   traZODone 50 MG tablet Commonly known as:  DESYREL Take 1 tablet (50 mg total) by mouth at bedtime.            Durable Medical Equipment  (From admission, onward)        Start     Ordered   01/19/18 1206  For home use only DME Walker rolling  Once    Question:  Patient needs a walker to treat with the following condition  Answer:  Weakness   01/19/18 1206      Vitals:   01/18/18 2009 01/19/18 0534  BP: 134/62 (!) 111/48  Pulse: 71 72  Resp: 20 20  Temp: 98.5  F (36.9 C) 98.4 F (36.9 C)  SpO2: 97% 96%    Suzzanne Cloud
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
## 2018-01-19 NOTE — Care Management Note (Addendum)
Case Management Note  Patient Details  Name: Ralph BowJames Ernest Grabe MRN: [id number] Date of Birth: 04/02/1936   [facility] to [facility] to [facility] today.  CSW facilitating.  PT has recommended home health PT.  Patient's sister is agreeable, she states that she would like to use the company that [facility].  Per [facility] preference is [facility].  Referral made to [facility]berly with [facility] for PT. Per [place]berley patient is already active.  MD to enter orders.  [facility] prior to discharge.  RNCM signing off.   Subjective/Objective:                    Action/Plan:   Expected Discharge Date:                  Expected Discharge Plan:  Home w Home Health Services  In-House Referral:     Discharge planning Services  CM Consult  Post Acute Care Choice:  Home Health, Durable Medical Equipment Choice offered to:  Sibling  DME Arranged:  Walker rolling DME Agency:  [facility]  HH Arranged:  PT HH Agency:  [facility]  Status of Service:  Completed, signed off  If discussed at Long Length of Stay Meetings, dates discussed:    Additional Comments:  Chapman FitchBOWEN, Cylis Ayars T, RN 01/19/2018, 10:33 AM

## 2018-01-22 ENCOUNTER — Emergency Department: Payer: Medicare Other

## 2018-01-22 ENCOUNTER — Emergency Department
Admission: EM | Admit: 2018-01-22 | Discharge: 2018-01-22 | Disposition: A | Discharge status: Home / Self Care | Payer: Medicare Other | Attending: Emergency Medicine | Admitting: Emergency Medicine

## 2018-01-22 ENCOUNTER — Encounter: Payer: Self-pay | Admitting: Emergency Medicine

## 2018-01-22 DIAGNOSIS — J449 Chronic obstructive pulmonary disease, unspecified: Secondary | ICD-10-CM | POA: Insufficient documentation

## 2018-01-22 DIAGNOSIS — I10 Essential (primary) hypertension: Secondary | ICD-10-CM | POA: Diagnosis not present

## 2018-01-22 DIAGNOSIS — E119 Type 2 diabetes mellitus without complications: Secondary | ICD-10-CM | POA: Insufficient documentation

## 2018-01-22 DIAGNOSIS — Y929 Unspecified place or not applicable: Secondary | ICD-10-CM | POA: Diagnosis not present

## 2018-01-22 DIAGNOSIS — S8262XA Displaced fracture of lateral malleolus of left fibula, initial encounter for closed fracture: Secondary | ICD-10-CM

## 2018-01-22 DIAGNOSIS — Y999 Unspecified external cause status: Secondary | ICD-10-CM | POA: Diagnosis not present

## 2018-01-22 DIAGNOSIS — S8265XA Nondisplaced fracture of lateral malleolus of left fibula, initial encounter for closed fracture: Secondary | ICD-10-CM | POA: Insufficient documentation

## 2018-01-22 DIAGNOSIS — S99822A Other specified injuries of left foot, initial encounter: Secondary | ICD-10-CM | POA: Diagnosis present

## 2018-01-22 DIAGNOSIS — Y939 Activity, unspecified: Secondary | ICD-10-CM | POA: Diagnosis not present

## 2018-01-22 DIAGNOSIS — Z87891 Personal history of nicotine dependence: Secondary | ICD-10-CM | POA: Insufficient documentation

## 2018-01-22 DIAGNOSIS — R350 Frequency of micturition: Secondary | ICD-10-CM | POA: Insufficient documentation

## 2018-01-22 DIAGNOSIS — I252 Old myocardial infarction: Secondary | ICD-10-CM | POA: Diagnosis not present

## 2018-01-22 DIAGNOSIS — W1830XA Fall on same level, unspecified, initial encounter: Secondary | ICD-10-CM | POA: Insufficient documentation

## 2018-01-22 LAB — CBC WITH DIFFERENTIAL/PLATELET
BASOS PCT: 1 %
Basophils Absolute: 0 10*3/uL (ref 0–0.1)
Eosinophils Absolute: 0.2 10*3/uL (ref 0–0.7)
Eosinophils Relative: 5 %
HEMATOCRIT: 34.6 % — AB (ref 40.0–52.0)
Hemoglobin: 12.2 g/dL — ABNORMAL LOW (ref 13.0–18.0)
LYMPHS ABS: 1 10*3/uL (ref 1.0–3.6)
Lymphocytes Relative: 23 %
MCH: 32.9 pg (ref 26.0–34.0)
MCHC: 35.3 g/dL (ref 32.0–36.0)
MCV: 93.3 fL (ref 80.0–100.0)
MONO ABS: 0.4 10*3/uL (ref 0.2–1.0)
MONOS PCT: 9 %
Neutro Abs: 2.9 10*3/uL (ref 1.4–6.5)
Neutrophils Relative %: 62 %
Platelets: 237 10*3/uL (ref 150–440)
RBC: 3.71 MIL/uL — ABNORMAL LOW (ref 4.40–5.90)
RDW: 13.3 % (ref 11.5–14.5)
WBC: 4.6 10*3/uL (ref 3.8–10.6)

## 2018-01-22 LAB — COMPREHENSIVE METABOLIC PANEL
ALBUMIN: 3.3 g/dL — AB (ref 3.5–5.0)
ALK PHOS: 57 U/L (ref 38–126)
ALT: 41 U/L (ref 17–63)
ANION GAP: 9 (ref 5–15)
AST: 51 U/L — ABNORMAL HIGH (ref 15–41)
BUN: 16 mg/dL (ref 6–20)
CALCIUM: 8.6 mg/dL — AB (ref 8.9–10.3)
CO2: 24 mmol/L (ref 22–32)
Chloride: 106 mmol/L (ref 101–111)
Creatinine, Ser: 0.98 mg/dL (ref 0.61–1.24)
GLUCOSE: 149 mg/dL — AB (ref 65–99)
POTASSIUM: 3.7 mmol/L (ref 3.5–5.1)
Sodium: 139 mmol/L (ref 135–145)
TOTAL PROTEIN: 6.9 g/dL (ref 6.5–8.1)
Total Bilirubin: 0.6 mg/dL (ref 0.3–1.2)

## 2018-01-22 LAB — URINALYSIS, COMPLETE (UACMP) WITH MICROSCOPIC
BILIRUBIN URINE: NEGATIVE
Bacteria, UA: NONE SEEN
GLUCOSE, UA: NEGATIVE mg/dL
Hgb urine dipstick: NEGATIVE
KETONES UR: NEGATIVE mg/dL
LEUKOCYTES UA: NEGATIVE
Nitrite: NEGATIVE
PH: 5 (ref 5.0–8.0)
Protein, ur: NEGATIVE mg/dL
SPECIFIC GRAVITY, URINE: 1.01 (ref 1.005–1.030)

## 2018-01-22 LAB — CK: CK TOTAL: 94 U/L (ref 49–397)

## 2018-01-22 MED ORDER — POLYETHYLENE GLYCOL 3350 17 G PO PACK: 17.0000 g | PACK | Freq: Every day | ORAL | 0 refills | Status: AC

## 2018-01-22 NOTE — ED Triage Notes (Signed)
Pt comes into the ED via POV with his sister who states he was released from the hospital Sunday due to a fall and other complications.  The patient now has a bruise present on the left foot that wraps around the foot to the toes.  Patient also has had darker than normal urine and increased urgency per the sister.  Patient is at his baseline and in NAD at this time.

## 2018-01-22 NOTE — ED Provider Notes (Signed)
[facility] Emergency Department Provider Note       Time seen: ----------------------------------------- 7:28 PM on 01/22/2018 ----------------------------------------- Level V caveat: History/ROS limited by intellectual disability  I have reviewed the triage vital signs and the nursing notes.  HISTORY   Chief Complaint Foot Injury and Urinary Tract Infection    HPI Ralph Boyd is a 82 y.o. male with a history of GERD, asthma, A. fib, hyperlipidemia, hypertension, MI who presents to the ED for bruising noted to the left foot and urine that is darker than normal.  Patient was just discharged from the hospital for dehydration due to gastroenteritis.  He denies any complaints at this time.  He is able to ambulate.  Past Medical History:  Diagnosis Date  . Acid reflux   . Anxiety   . Asthma   . Atrial fibrillation (HCC)   . BPH (benign prostatic hyperplasia)   . Heart disease   . HLD (hyperlipidemia)   . HTN (hypertension)   . Kidney disease   . MI (myocardial infarction) [facility])     Patient Active Problem List   Diagnosis Date Noted  . Acute gastroenteritis 01/17/2018  . Arteriosclerosis of coronary artery 07/27/2015  . Diabetes mellitus, type 2 (HCC) 07/27/2015  . Hypercholesteremia 07/27/2015  . BP (high blood pressure) 07/27/2015  . Intellectual disability 07/13/2015  . COPD, moderate (HCC) 07/28/2014    Past Surgical History:  Procedure Laterality Date  . HERNIA REPAIR      Allergies Patient has no known allergies.  Social History Social History   Tobacco Use  . Smoking status: Former Games developer  . Smokeless tobacco: Never Used  . Tobacco comment: Quit 6 years  Substance Use Topics  . Alcohol use: No    Alcohol/week: 0.0 oz  . Drug use: No    Review of Systems Constitutional: Negative for fever. Cardiovascular: Negative for chest pain. Respiratory: Negative for shortness of breath. Gastrointestinal: Negative for  abdominal pain, vomiting and diarrhea. Genitourinary: Positive for polyuria Musculoskeletal: Negative for back pain. Skin: Positive for bruising in the left ankle and left foot Neurological: Negative for headaches, focal weakness or numbness.  All systems negative/normal/unremarkable except as stated in the HPI  ____________________________________________   PHYSICAL EXAM:  VITAL SIGNS: ED Triage Vitals  Enc Vitals Group     BP 01/22/18 1923 131/68     Pulse Rate 01/22/18 1923 81     Resp 01/22/18 1923 (!) 22     Temp 01/22/18 1923 97.8 F (36.6 C)     Temp Source 01/22/18 1923 Oral     SpO2 01/22/18 1923 95 %     Weight 01/22/18 1920 177 lb (80.3 kg)     Height 01/22/18 1920 5\' 8"  (1.727 m)     Head Circumference --      Peak Flow --      Pain Score 01/22/18 1920 2     Pain Loc --      Pain Edu? --      Excl. in GC? --    Constitutional: Well appearing and in no distress. Eyes: Conjunctivae are normal. Normal extraocular movements. Cardiovascular: Normal rate, regular rhythm. No murmurs, rubs, or gallops.  Good distal pulses in both feet Respiratory: Normal respiratory effort without tachypnea nor retractions. Breath sounds are clear and equal bilaterally. No wheezes/rales/rhonchi. Gastrointestinal: Soft and nontender. Normal bowel sounds Musculoskeletal: Nontender with normal range of motion in extremities. No lower extremity tenderness nor edema. Neurologic:  Normal speech and language. No  gross focal neurologic deficits are appreciated.  Skin: Ecchymosis is noted around the left ankle laterally and left foot distally.  Mild edema is noted. Psychiatric: Mood and affect are normal. Speech and behavior are normal.  ____________________________________________  ED COURSE:  As part of my medical decision making, I reviewed the following data within the electronic MEDICAL RECORD NUMBER History obtained from family if available, nursing notes, old chart and ekg, as well as notes  from prior ED visits. Patient presented for recent fall and noted ecchymosis with possible dark urine, we will assess with labs and imaging as indicated at this time.   Procedures ____________________________________________   LABS (pertinent positives/negatives)  Labs Reviewed  URINALYSIS, COMPLETE (UACMP) WITH MICROSCOPIC - Abnormal; Notable for the following components:      Result Value   Color, Urine YELLOW (*)    APPearance CLEAR (*)    Squamous Epithelial / LPF 0-5 (*)    All other components within normal limits  CBC WITH DIFFERENTIAL/PLATELET - Abnormal; Notable for the following components:   RBC 3.71 (*)    Hemoglobin 12.2 (*)    HCT 34.6 (*)    All other components within normal limits  COMPREHENSIVE METABOLIC PANEL - Abnormal; Notable for the following components:   Glucose, Bld 149 (*)    Calcium 8.6 (*)    Albumin 3.3 (*)    AST 51 (*)    All other components within normal limits  CK    RADIOLOGY Images were viewed by me  Left ankle, left foot x-rays IMPRESSION: Nondisplaced lateral malleolus fracture.  Soft tissue swelling about the ankle.  Foot x-ray is negative ____________________________________________  DIFFERENTIAL DIAGNOSIS   Contusion, fracture, dislocation, UTI, hematuria, renal failure  FINAL ASSESSMENT AND PLAN  Lateral malleolus fracture   Plan: The patient had presented for left foot and ankle bruising as well as dark urine. Patient's labs were reassuring. Patient's imaging did reveal a lateral malleolus fracture that is nondisplaced.  Ordinarily I would want to place him in a splint however he has difficulty walking at baseline and I am afraid this may make him fall.  We will apply an Ace wrap and refer him to orthopedics for follow-up.   Ulice DashJohnathan E Williams, MD   Note: This note was generated in part or whole with voice recognition software. Voice recognition is usually quite accurate but there are transcription errors that can  and very often do occur. I apologize for any typographical errors that were not detected and corrected. Emily Filbert\    Williams, Jonathan E, MD 01/22/18 2115

## 2018-04-29 ENCOUNTER — Other Ambulatory Visit: Payer: Self-pay | Admitting: Specialist

## 2018-04-29 ENCOUNTER — Ambulatory Visit
Admission: RE | Admit: 2018-04-29 | Discharge: 2018-04-29 | Disposition: A | Discharge status: Home / Self Care | Payer: Medicare Other | Source: Ambulatory Visit | Attending: Specialist | Admitting: Specialist

## 2018-04-29 DIAGNOSIS — M7989 Other specified soft tissue disorders: Secondary | ICD-10-CM | POA: Diagnosis present

## 2018-04-29 DIAGNOSIS — M79662 Pain in left lower leg: Secondary | ICD-10-CM

## 2018-04-29 DIAGNOSIS — M79605 Pain in left leg: Secondary | ICD-10-CM | POA: Diagnosis not present

## 2018-04-29 DIAGNOSIS — J8410 Pulmonary fibrosis, unspecified: Secondary | ICD-10-CM | POA: Diagnosis present

## 2018-04-30 ENCOUNTER — Other Ambulatory Visit: Payer: Self-pay | Admitting: Specialist

## 2018-04-30 DIAGNOSIS — R0609 Other forms of dyspnea: Secondary | ICD-10-CM

## 2018-04-30 DIAGNOSIS — R053 Chronic cough: Secondary | ICD-10-CM

## 2018-04-30 DIAGNOSIS — J8410 Pulmonary fibrosis, unspecified: Secondary | ICD-10-CM

## 2018-04-30 DIAGNOSIS — R05 Cough: Secondary | ICD-10-CM

## 2018-05-11 ENCOUNTER — Ambulatory Visit
Admission: RE | Admit: 2018-05-11 | Discharge: 2018-05-11 | Disposition: A | Discharge status: Home / Self Care | Payer: Medicare Other | Source: Ambulatory Visit | Attending: Specialist | Admitting: Specialist

## 2018-05-11 DIAGNOSIS — K8020 Calculus of gallbladder without cholecystitis without obstruction: Secondary | ICD-10-CM | POA: Diagnosis not present

## 2018-05-11 DIAGNOSIS — R05 Cough: Secondary | ICD-10-CM

## 2018-05-11 DIAGNOSIS — I2510 Atherosclerotic heart disease of native coronary artery without angina pectoris: Secondary | ICD-10-CM | POA: Diagnosis not present

## 2018-05-11 DIAGNOSIS — K449 Diaphragmatic hernia without obstruction or gangrene: Secondary | ICD-10-CM | POA: Diagnosis not present

## 2018-05-11 DIAGNOSIS — R053 Chronic cough: Secondary | ICD-10-CM

## 2018-05-11 DIAGNOSIS — J8410 Pulmonary fibrosis, unspecified: Secondary | ICD-10-CM | POA: Insufficient documentation

## 2018-05-11 DIAGNOSIS — I700 Atherosclerosis of aorta: Secondary | ICD-10-CM | POA: Insufficient documentation

## 2018-05-11 DIAGNOSIS — R0609 Other forms of dyspnea: Secondary | ICD-10-CM | POA: Diagnosis present

## 2018-05-12 ENCOUNTER — Ambulatory Visit: Payer: Medicare Other

## 2018-06-11 ENCOUNTER — Emergency Department: Payer: Medicare Other

## 2018-06-11 ENCOUNTER — Other Ambulatory Visit: Payer: Self-pay

## 2018-06-11 ENCOUNTER — Emergency Department
Admission: EM | Admit: 2018-06-11 | Discharge: 2018-06-11 | Disposition: A | Discharge status: Home / Self Care | Payer: Medicare Other | Attending: Emergency Medicine | Admitting: Emergency Medicine

## 2018-06-11 DIAGNOSIS — I2510 Atherosclerotic heart disease of native coronary artery without angina pectoris: Secondary | ICD-10-CM | POA: Diagnosis not present

## 2018-06-11 DIAGNOSIS — R0602 Shortness of breath: Secondary | ICD-10-CM | POA: Diagnosis not present

## 2018-06-11 DIAGNOSIS — I110 Hypertensive heart disease with heart failure: Secondary | ICD-10-CM | POA: Diagnosis not present

## 2018-06-11 DIAGNOSIS — Z87891 Personal history of nicotine dependence: Secondary | ICD-10-CM | POA: Insufficient documentation

## 2018-06-11 DIAGNOSIS — J45909 Unspecified asthma, uncomplicated: Secondary | ICD-10-CM | POA: Insufficient documentation

## 2018-06-11 DIAGNOSIS — I252 Old myocardial infarction: Secondary | ICD-10-CM | POA: Insufficient documentation

## 2018-06-11 DIAGNOSIS — F419 Anxiety disorder, unspecified: Secondary | ICD-10-CM | POA: Diagnosis not present

## 2018-06-11 DIAGNOSIS — I5023 Acute on chronic systolic (congestive) heart failure: Secondary | ICD-10-CM | POA: Diagnosis not present

## 2018-06-11 DIAGNOSIS — T7621XA Adult sexual abuse, suspected, initial encounter: Secondary | ICD-10-CM | POA: Diagnosis present

## 2018-06-11 LAB — URINALYSIS, COMPLETE (UACMP) WITH MICROSCOPIC
BACTERIA UA: NONE SEEN
Bilirubin Urine: NEGATIVE
GLUCOSE, UA: NEGATIVE mg/dL
HGB URINE DIPSTICK: NEGATIVE
KETONES UR: NEGATIVE mg/dL
LEUKOCYTES UA: NEGATIVE
NITRITE: NEGATIVE
PROTEIN: NEGATIVE mg/dL
Specific Gravity, Urine: 1.008 (ref 1.005–1.030)
pH: 6 (ref 5.0–8.0)

## 2018-06-11 LAB — BASIC METABOLIC PANEL
Anion gap: 10 (ref 5–15)
BUN: 18 mg/dL (ref 8–23)
CO2: 31 mmol/L (ref 22–32)
Calcium: 9 mg/dL (ref 8.9–10.3)
Chloride: 98 mmol/L (ref 98–111)
Creatinine, Ser: 1.07 mg/dL (ref 0.61–1.24)
GFR calc Af Amer: >60 mL/min (ref >60)
GLUCOSE: 100 mg/dL — AB (ref 70–99)
POTASSIUM: 3.9 mmol/L (ref 3.5–5.1)
Sodium: 139 mmol/L (ref 135–145)

## 2018-06-11 LAB — CBC WITH DIFFERENTIAL/PLATELET
BASOS PCT: 1 %
Basophils Absolute: 0.1 10*3/uL (ref 0–0.1)
EOS ABS: 0.6 10*3/uL (ref 0–0.7)
Eosinophils Relative: 11 %
HCT: 37.9 % — ABNORMAL LOW (ref 40.0–52.0)
HEMOGLOBIN: 12.8 g/dL — AB (ref 13.0–18.0)
LYMPHS ABS: 1.4 10*3/uL (ref 1.0–3.6)
Lymphocytes Relative: 23 %
MCH: 30 pg (ref 26.0–34.0)
MCHC: 33.7 g/dL (ref 32.0–36.0)
MCV: 88.9 fL (ref 80.0–100.0)
MONOS PCT: 11 %
Monocytes Absolute: 0.7 10*3/uL (ref 0.2–1.0)
NEUTROS ABS: 3.3 10*3/uL (ref 1.4–6.5)
NEUTROS PCT: 54 %
PLATELETS: 187 10*3/uL (ref 150–440)
RBC: 4.26 MIL/uL — ABNORMAL LOW (ref 4.40–5.90)
RDW: 15.9 % — ABNORMAL HIGH (ref 11.5–14.5)
WBC: 6.1 10*3/uL (ref 3.8–10.6)

## 2018-06-11 LAB — TROPONIN I: Troponin I: 0.03 ng/mL (ref <0.03)

## 2018-06-11 LAB — BRAIN NATRIURETIC PEPTIDE: B Natriuretic Peptide: 300 pg/mL — ABNORMAL HIGH (ref 0.0–100.0)

## 2018-06-11 MED ORDER — FUROSEMIDE 10 MG/ML IJ SOLN
40.0000 mg | Freq: Once | INTRAMUSCULAR | Status: AC
  Administered 2018-06-11: 40 mg via INTRAVENOUS
  Filled 2018-06-11: qty 4
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
## 2018-06-11 NOTE — ED Notes (Signed)
Date and time results received: 06/11/18 1:41 PM   Test: Troponin Critical Value: 0.03 ng/mL  Name of Provider Notified: Dr. Mayford KnifeWilliams

## 2018-06-29 NOTE — H&P (View-Only) (Signed)
06/30/2018 3:18 PM   [facility] 1935/12/16 [id number]  Referring provider: [facility] [address] [place], [place] [address]  Chief Complaint  Patient presents with  . Hematuria    HPI: Patient is an 82 year old Caucasian male with MR who presents with his sister, [name], for a recheck.  History is obtained from sister.  He was last seen in our office in 2017 for the complaint of spots of blood found in his underpants.  He was not found to have blood on microscopic analysis of his urine and his sister and caregivers were to report back to the office if they should see any gross hematuria with Mr. Tarnow.  His sister states that she has not noted any further blood in the urine, but her concern today is he is constantly in the restroom urinating and taking a long time to urinate.  She has noticed this as well as the staff at the [place] [address]  She carried him to his primary care and had a urine checked and was told there was no infection.  Today, she is complaining of frequency, urgency, nocturia, leakage of urine and straining to urinate with her brother.  She has not noted any gross hematuria, dysuria or suprapubic/flank pain with her brother.  She has not noted any fevers, chills, nausea or vomiting with her brother.    His PVR today is  71 mL.    PMH: Past Medical History:  Diagnosis Date  . Acid reflux   . Anxiety   . Asthma   . Atrial fibrillation (HCC)   . BPH (benign prostatic hyperplasia)   . Heart disease   . HLD (hyperlipidemia)   . HTN (hypertension)   . Kidney disease   . MI (myocardial infarction) [facility])     Surgical History: Past Surgical History:  Procedure Laterality Date  . HERNIA REPAIR      Home Medications:  Allergies as of 06/30/2018   No Known Allergies     Medication List        Accurate as of 06/30/18  3:18 PM. Always use your most recent med list.          acetaminophen 325 MG  tablet Commonly known as:  TYLENOL Take 650 mg by mouth every 4 (four) hours as needed for mild pain or fever.   ADVAIR HFA 45-21 MCG/ACT inhaler Generic drug:  fluticasone-salmeterol INHALE 2 PUFFS BY MOUTH TWICE DAILY. **RINSE MOUTH AFTER EACH USE**   albuterol 108 (90 Base) MCG/ACT inhaler Commonly known as:  PROVENTIL HFA;VENTOLIN HFA Inhale 2 puffs into the lungs every 4 (four) hours as needed for wheezing or shortness of breath.   aspirin 81 MG chewable tablet CHEW AND SWALLOW ONE TABLET BY MOUTH ONCE DAILY.   benzonatate 100 MG capsule Commonly known as:  TESSALON Take 100 mg by mouth 3 (three) times daily as needed for cough.   citalopram 10 MG tablet Commonly known as:  CELEXA Take 1 tablet (10MG ) by mouth every day   CRANBERRY JUICE EXTRACT PO Take 8 oz by mouth daily.   diphenhydrAMINE 25 MG tablet Commonly known as:  BENADRYL Take 25 mg by mouth every 6 (six) hours as needed for allergies.   finasteride 5 MG tablet Commonly known as:  PROSCAR TAKE 1 TABLET BY MOUTH DAILY.   fluticasone 50 MCG/ACT nasal spray Commonly known as:  FLONASE Place 2 sprays into both nostrils daily.   furosemide 40 MG tablet Commonly  known as:  LASIX Take 40 mg by mouth 2 (two) times daily.   GOLD BOND FOOT Crea Apply 1 application topically 2 (two) times daily. (to legs)   guaiFENesin 100 MG/5ML liquid Commonly known as:  ROBITUSSIN Take 300 mg by mouth every 6 (six) hours as needed for cough.   HYDROCERIN EX Apply 1 application topically 3 (three) times daily.   hydrocortisone 2.5 % cream Apply 1 application topically 2 (two) times daily. (to facial rash until healed)   INCRUSE ELLIPTA 62.5 MCG/INH Aepb Generic drug:  umeclidinium bromide Inhale 1 puff into the lungs daily.   isosorbide mononitrate 60 MG 24 hr tablet Commonly known as:  IMDUR TAKE 1 TABLET BY MOUTH TWICE DAILY.   loperamide 2 MG tablet Commonly known as:  IMODIUM A-D Take 2-4 mg by mouth as  needed for diarrhea or loose stools. (max 8 doses daily)   loratadine 10 MG tablet Commonly known as:  CLARITIN Take 10 mg by mouth daily.   losartan 50 MG tablet Commonly known as:  COZAAR Take 50 mg by mouth daily.   magnesium hydroxide 400 MG/5ML suspension Commonly known as:  MILK OF MAGNESIA Take 30 mLs by mouth daily as needed for mild constipation.   mineral oil external liquid Apply 1 application topically daily as needed (flaky patches on and around face).   MINTOX 200-200-20 MG/5ML suspension Generic drug:  alum & mag hydroxide-simeth Take 30 mLs by mouth 4 (four) times daily as needed for indigestion or heartburn.   mirtazapine 30 MG tablet Commonly known as:  REMERON Take 30 mg by mouth at bedtime.   nitroGLYCERIN 0.4 MG SL tablet Commonly known as:  NITROSTAT Dissolve 1 tablet under the tongue every 5 minutes as needed for chest pain - max 3 doses   nystatin powder Commonly known as:  MYCOSTATIN/NYSTOP Apply 1 g topically 2 (two) times daily. (to groin area until healed)   pantoprazole 40 MG tablet Commonly known as:  PROTONIX Take 40 mg by mouth 2 (two) times daily.   polyethylene glycol packet Commonly known as:  MIRALAX / GLYCOLAX Take 17 g by mouth daily.   ranolazine 500 MG 12 hr tablet Commonly known as:  RANEXA Take 500 mg by mouth 2 (two) times daily.   tamsulosin 0.4 MG Caps capsule Commonly known as:  FLOMAX Take 1 capsule (0.4 mg total) by mouth daily.   traZODone 50 MG tablet Commonly known as:  DESYREL Take 1 tablet (50 mg total) by mouth at bedtime.   triamcinolone cream 0.1 % Commonly known as:  KENALOG Apply 1 application topically 2 (two) times daily as needed ((itching or rash on scrotum)).       Allergies: No Known Allergies  Family History: History reviewed. No pertinent family history.  Social History:  reports that he has quit smoking. He has never used smokeless tobacco. He reports that he does not drink alcohol or  use drugs.  ROS: UROLOGY Frequent Urination?: Yes Hard to postpone urination?: Yes Burning/pain with urination?: No Get up at night to urinate?: Yes Leakage of urine?: Yes Urine stream starts and stops?: No Trouble starting stream?: Yes Do you have to strain to urinate?: No Blood in urine?: No Urinary tract infection?: No Sexually transmitted disease?: No Injury to kidneys or bladder?: No Painful intercourse?: No Weak stream?: No Erection problems?: No Penile pain?: No  Gastrointestinal Nausea?: Yes Vomiting?: Yes Indigestion/heartburn?: Yes Diarrhea?: Yes Constipation?: Yes  Constitutional Fever: Yes Night sweats?: Yes Weight loss?: No  Fatigue?: Yes  Skin Skin rash/lesions?: Yes Itching?: Yes  Eyes Blurred vision?: No Double vision?: No  Ears/Nose/Throat Sore throat?: No  Hematologic/Lymphatic Swollen glands?: No Easy bruising?: Yes  Cardiovascular Leg swelling?: Yes Chest pain?: Yes  Respiratory Cough?: Yes Shortness of breath?: Yes  Endocrine Excessive thirst?: Yes  Musculoskeletal Back pain?: No Joint pain?: No  Neurological Headaches?: Yes Dizziness?: No  Psychologic Depression?: Yes Anxiety?: Yes  Physical Exam: BP 131/74 (BP Location: Left Arm, Patient Position: Sitting, Cuff Size: Normal)   Pulse 74   Ht 5\' 8"  (1.727 m)   Wt 180 lb 14.4 oz (82.1 kg)   BMI 27.51 kg/m   Constitutional: Well nourished. Alert and oriented, No acute distress. HEENT: [place] AT, moist mucus membranes. Trachea midline, no masses. Cardiovascular: No clubbing, cyanosis, or edema. Respiratory: Normal respiratory effort, no increased work of breathing. GI: Abdomen is soft, non tender, non distended, no abdominal masses. Liver and spleen not palpable.  No hernias appreciated.  Stool sample for occult testing is not indicated.   GU: No CVA tenderness.  No bladder fullness or masses.  Patient with uncircumcised phallus.  Foreskin could not be retracted.   Phimosis.  Urethral meatus is patent.  No penile discharge. No penile lesions or rashes. Scrotum without lesions, cysts, rashes and/or edema.  Testicles are located scrotally bilaterally. No masses are appreciated in the testicles. Left and right epididymis are normal. Rectal: Patient with  normal sphincter tone. Anus and perineum without scarring or rashes. No rectal masses are appreciated. Prostate is approximately 35 grams, 4 mm x 4mm nodules in the right midportion and left apex appreciated. Seminal vesicles are normal. Skin: No rashes, bruises or suspicious lesions. Lymph: No cervical or inguinal adenopathy. Neurologic: Grossly intact, no focal deficits, moving all 4 extremities. Psychiatric: Normal mood and affect.  Laboratory Data: Lab Results  Component Value Date   WBC 6.1 06/11/2018   HGB 12.8 (L) 06/11/2018   HCT 37.9 (L) 06/11/2018   MCV 88.9 06/11/2018   PLT 187 06/11/2018    Lab Results  Component Value Date   CREATININE 1.07 06/11/2018    Lab Results  Component Value Date   AST 51 (H) 01/22/2018   Lab Results  Component Value Date   ALT 41 01/22/2018   PSA history  1.5 ng/mL on 07/03/2015  Results for orders placed or performed in visit on 06/30/18  Bladder Scan (Post Void Residual) in office  Result Value Ref Range   Scan Result 71    I have reviewed the labs.  Assessment & Plan:    1. Phimosis Explained physical exam to his sister.  I stated that the fact that the foreskin could not be retracted at all may be leading to gland irritation.  I also explained that the leakage may be the urine that gets trapped in the foreskin that is dripping out later.  I also stated that since I cannot see the head of the glans today it would be difficult to know if there was any infection or cancer underneath the foreskin.   I explained to his sister that a circumcision would be able to correct this but due to his MR he would have to be scheduled for the circumcision under  general anesthesia in the operating room.  She is agreeable to this.  I explained to the sister how the circumcision is performed and that it is about a 2-week recovery that can be painful and have penile swelling.  She understands and wishes  to proceed with the procedure as she hopes it would give him a more quality of life.  I did express the risk of circumcision as bleeding, infection and possibly penile deformity. I also explained that he could undergo a circumcision and his urinary symptoms not improved and we would need to perform further evaluation and treatments. He is on a daily aspirin and has cardiac history therefore we will obtain cardiac clearance from his cardiologist prior to pursuing circumcision I also explained the risks of general anesthesia, such as: MI, CVA, paralysis, coma and/or death.    Return for Circumcision in the OR.  These notes generated with voice recognition software. I apologize for typographical errors.  Michiel Cowboy, PA-C  [facility] [address] [place], [place] [address] [phone]

## 2018-06-29 NOTE — Progress Notes (Signed)
  06/30/2018 3:18 PM   Ralph Boyd 11/25/1935 [id number]  Referring provider: [facility] [address] [place], [place] [address]  Chief Complaint  Patient presents with  . Hematuria    HPI: Patient is an 82-year-old Caucasian male with MR who presents with his sister, [name], for a recheck.  History is obtained from sister.  He was last seen in our office in 2017 for the complaint of spots of blood found in his underpants.  He was not found to have blood on microscopic analysis of his urine and his sister and caregivers were to report back to the office if they should see any gross hematuria with Mr. Laatsch.  His sister states that she has not noted any further blood in the urine, but her concern today is he is constantly in the restroom urinating and taking a long time to urinate.  She has noticed this as well as the staff at the [facility].  She carried him to his primary care and had a urine checked and was told there was no infection.  Today, she is complaining of frequency, urgency, nocturia, leakage of urine and straining to urinate with her brother.  She has not noted any gross hematuria, dysuria or suprapubic/flank pain with her brother.  She has not noted any fevers, chills, nausea or vomiting with her brother.    His PVR today is  71 mL.    PMH: Past Medical History:  Diagnosis Date  . Acid reflux   . Anxiety   . Asthma   . Atrial fibrillation (HCC)   . BPH (benign prostatic hyperplasia)   . Heart disease   . HLD (hyperlipidemia)   . HTN (hypertension)   . Kidney disease   . MI (myocardial infarction) (HCC)     Surgical History: Past Surgical History:  Procedure Laterality Date  . HERNIA REPAIR      Home Medications:  Allergies as of 06/30/2018   No Known Allergies     Medication List        Accurate as of 06/30/18  3:18 PM. Always use your most recent med list.          acetaminophen 325 MG  tablet Commonly known as:  TYLENOL Take 650 mg by mouth every 4 (four) hours as needed for mild pain or fever.   ADVAIR HFA 45-21 MCG/ACT inhaler Generic drug:  fluticasone-salmeterol INHALE 2 PUFFS BY MOUTH TWICE DAILY. **RINSE MOUTH AFTER EACH USE**   albuterol 108 (90 Base) MCG/ACT inhaler Commonly known as:  PROVENTIL HFA;VENTOLIN HFA Inhale 2 puffs into the lungs every 4 (four) hours as needed for wheezing or shortness of breath.   aspirin 81 MG chewable tablet CHEW AND SWALLOW ONE TABLET BY MOUTH ONCE DAILY.   benzonatate 100 MG capsule Commonly known as:  TESSALON Take 100 mg by mouth 3 (three) times daily as needed for cough.   citalopram 10 MG tablet Commonly known as:  CELEXA Take 1 tablet (10MG) by mouth every day   CRANBERRY JUICE EXTRACT PO Take 8 oz by mouth daily.   diphenhydrAMINE 25 MG tablet Commonly known as:  BENADRYL Take 25 mg by mouth every 6 (six) hours as needed for allergies.   finasteride 5 MG tablet Commonly known as:  PROSCAR TAKE 1 TABLET BY MOUTH DAILY.   fluticasone 50 MCG/ACT nasal spray Commonly known as:  FLONASE Place 2 sprays into both nostrils daily.   furosemide 40 MG tablet Commonly   known as:  LASIX Take 40 mg by mouth 2 (two) times daily.   GOLD BOND FOOT Crea Apply 1 application topically 2 (two) times daily. (to legs)   guaiFENesin 100 MG/5ML liquid Commonly known as:  ROBITUSSIN Take 300 mg by mouth every 6 (six) hours as needed for cough.   HYDROCERIN EX Apply 1 application topically 3 (three) times daily.   hydrocortisone 2.5 % cream Apply 1 application topically 2 (two) times daily. (to facial rash until healed)   INCRUSE ELLIPTA 62.5 MCG/INH Aepb Generic drug:  umeclidinium bromide Inhale 1 puff into the lungs daily.   isosorbide mononitrate 60 MG 24 hr tablet Commonly known as:  IMDUR TAKE 1 TABLET BY MOUTH TWICE DAILY.   loperamide 2 MG tablet Commonly known as:  IMODIUM A-D Take 2-4 mg by mouth as  needed for diarrhea or loose stools. (max 8 doses daily)   loratadine 10 MG tablet Commonly known as:  CLARITIN Take 10 mg by mouth daily.   losartan 50 MG tablet Commonly known as:  COZAAR Take 50 mg by mouth daily.   magnesium hydroxide 400 MG/5ML suspension Commonly known as:  MILK OF MAGNESIA Take 30 mLs by mouth daily as needed for mild constipation.   mineral oil external liquid Apply 1 application topically daily as needed (flaky patches on and around face).   MINTOX 200-200-20 MG/5ML suspension Generic drug:  alum & mag hydroxide-simeth Take 30 mLs by mouth 4 (four) times daily as needed for indigestion or heartburn.   mirtazapine 30 MG tablet Commonly known as:  REMERON Take 30 mg by mouth at bedtime.   nitroGLYCERIN 0.4 MG SL tablet Commonly known as:  NITROSTAT Dissolve 1 tablet under the tongue every 5 minutes as needed for chest pain - max 3 doses   nystatin powder Commonly known as:  MYCOSTATIN/NYSTOP Apply 1 g topically 2 (two) times daily. (to groin area until healed)   pantoprazole 40 MG tablet Commonly known as:  PROTONIX Take 40 mg by mouth 2 (two) times daily.   polyethylene glycol packet Commonly known as:  MIRALAX / GLYCOLAX Take 17 g by mouth daily.   ranolazine 500 MG 12 hr tablet Commonly known as:  RANEXA Take 500 mg by mouth 2 (two) times daily.   tamsulosin 0.4 MG Caps capsule Commonly known as:  FLOMAX Take 1 capsule (0.4 mg total) by mouth daily.   traZODone 50 MG tablet Commonly known as:  DESYREL Take 1 tablet (50 mg total) by mouth at bedtime.   triamcinolone cream 0.1 % Commonly known as:  KENALOG Apply 1 application topically 2 (two) times daily as needed ((itching or rash on scrotum)).       Allergies: No Known Allergies  Family History: History reviewed. No pertinent family history.  Social History:  reports that he has quit smoking. He has never used smokeless tobacco. He reports that he does not drink alcohol or  use drugs.  ROS: UROLOGY Frequent Urination?: Yes Hard to postpone urination?: Yes Burning/pain with urination?: No Get up at night to urinate?: Yes Leakage of urine?: Yes Urine stream starts and stops?: No Trouble starting stream?: Yes Do you have to strain to urinate?: No Blood in urine?: No Urinary tract infection?: No Sexually transmitted disease?: No Injury to kidneys or bladder?: No Painful intercourse?: No Weak stream?: No Erection problems?: No Penile pain?: No  Gastrointestinal Nausea?: Yes Vomiting?: Yes Indigestion/heartburn?: Yes Diarrhea?: Yes Constipation?: Yes  Constitutional Fever: Yes Night sweats?: Yes Weight loss?: No   Fatigue?: Yes  Skin Skin rash/lesions?: Yes Itching?: Yes  Eyes Blurred vision?: No Double vision?: No  Ears/Nose/Throat Sore throat?: No  Hematologic/Lymphatic Swollen glands?: No Easy bruising?: Yes  Cardiovascular Leg swelling?: Yes Chest pain?: Yes  Respiratory Cough?: Yes Shortness of breath?: Yes  Endocrine Excessive thirst?: Yes  Musculoskeletal Back pain?: No Joint pain?: No  Neurological Headaches?: Yes Dizziness?: No  Psychologic Depression?: Yes Anxiety?: Yes  Physical Exam: BP 131/74 (BP Location: Left Arm, Patient Position: Sitting, Cuff Size: Normal)   Pulse 74   Ht 5' 8" (1.727 m)   Wt 180 lb 14.4 oz (82.1 kg)   BMI 27.51 kg/m   Constitutional: Well nourished. Alert and oriented, No acute distress. HEENT: [place] AT, moist mucus membranes. Trachea midline, no masses. Cardiovascular: No clubbing, cyanosis, or edema. Respiratory: Normal respiratory effort, no increased work of breathing. GI: Abdomen is soft, non tender, non distended, no abdominal masses. Liver and spleen not palpable.  No hernias appreciated.  Stool sample for occult testing is not indicated.   GU: No CVA tenderness.  No bladder fullness or masses.  Patient with uncircumcised phallus.  Foreskin could not be retracted.   Phimosis.  Urethral meatus is patent.  No penile discharge. No penile lesions or rashes. Scrotum without lesions, cysts, rashes and/or edema.  Testicles are located scrotally bilaterally. No masses are appreciated in the testicles. Left and right epididymis are normal. Rectal: Patient with  normal sphincter tone. Anus and perineum without scarring or rashes. No rectal masses are appreciated. Prostate is approximately 35 grams, 4 mm x 4mm nodules in the right midportion and left apex appreciated. Seminal vesicles are normal. Skin: No rashes, bruises or suspicious lesions. Lymph: No cervical or inguinal adenopathy. Neurologic: Grossly intact, no focal deficits, moving all 4 extremities. Psychiatric: Normal mood and affect.  Laboratory Data: Lab Results  Component Value Date   WBC 6.1 06/11/2018   HGB 12.8 (L) 06/11/2018   HCT 37.9 (L) 06/11/2018   MCV 88.9 06/11/2018   PLT 187 06/11/2018    Lab Results  Component Value Date   CREATININE 1.07 06/11/2018    Lab Results  Component Value Date   AST 51 (H) 01/22/2018   Lab Results  Component Value Date   ALT 41 01/22/2018   PSA history  1.5 ng/mL on 07/03/2015  Results for orders placed or performed in visit on 06/30/18  Bladder Scan (Post Void Residual) in office  Result Value Ref Range   Scan Result 71    I have reviewed the labs.  Assessment & Plan:    1. Phimosis Explained physical exam to his sister.  I stated that the fact that the foreskin could not be retracted at all may be leading to gland irritation.  I also explained that the leakage may be the urine that gets trapped in the foreskin that is dripping out later.  I also stated that since I cannot see the head of the glans today it would be difficult to know if there was any infection or cancer underneath the foreskin.   I explained to his sister that a circumcision would be able to correct this but due to his MR he would have to be scheduled for the circumcision under  general anesthesia in the operating room.  She is agreeable to this.  I explained to the sister how the circumcision is performed and that it is about a 2-week recovery that can be painful and have penile swelling.  She understands and wishes   to proceed with the procedure as she hopes it would give him a more quality of life.  I did express the risk of circumcision as bleeding, infection and possibly penile deformity. I also explained that he could undergo a circumcision and his urinary symptoms not improved and we would need to perform further evaluation and treatments. He is on a daily aspirin and has cardiac history therefore we will obtain cardiac clearance from his cardiologist prior to pursuing circumcision I also explained the risks of general anesthesia, such as: MI, CVA, paralysis, coma and/or death.    Return for Circumcision in the OR.  These notes generated with voice recognition software. I apologize for typographical errors.  Quadasia Newsham, PA-C  [facility] [address] [place], [place] [address] [phone]   

## 2018-06-30 ENCOUNTER — Ambulatory Visit (INDEPENDENT_AMBULATORY_CARE_PROVIDER_SITE_OTHER): Payer: Medicare Other | Admitting: Urology

## 2018-06-30 ENCOUNTER — Encounter: Payer: Self-pay | Admitting: Urology

## 2018-06-30 ENCOUNTER — Telehealth: Payer: Self-pay | Admitting: Radiology

## 2018-06-30 VITALS — BP 131/74 | HR 74 | Ht 68.0 in | Wt 180.9 lb

## 2018-06-30 DIAGNOSIS — Z87448 Personal history of other diseases of urinary system: Secondary | ICD-10-CM | POA: Diagnosis not present

## 2018-06-30 DIAGNOSIS — N471 Phimosis: Secondary | ICD-10-CM

## 2018-06-30 LAB — MICROSCOPIC EXAMINATION

## 2018-06-30 LAB — URINALYSIS, COMPLETE
BILIRUBIN UA: NEGATIVE
Glucose, UA: NEGATIVE
KETONES UA: NEGATIVE
Nitrite, UA: NEGATIVE
PROTEIN UA: NEGATIVE
RBC UA: NEGATIVE
SPEC GRAV UA: 1.015 (ref 1.005–1.030)
Urobilinogen, Ur: 1 mg/dL (ref 0.2–1.0)
pH, UA: 7 (ref 5.0–7.5)

## 2018-06-30 LAB — BLADDER SCAN AMB NON-IMAGING: SCAN RESULT: 71

## 2018-06-30 NOTE — Telephone Encounter (Signed)
Ralph HerterShannon discussed with patient & sister, Mena GoesShelby Cheek, about having home health visit patient at [facility] where he resides after circumcision scheduled 07/13/2018. Sister would like more information regarding getting this set up. She may be reached at [phone].

## 2018-06-30 NOTE — Telephone Encounter (Signed)
Cala BradfordKimberly from home health will come by and help get this set up.

## 2018-07-01 ENCOUNTER — Other Ambulatory Visit: Payer: Self-pay | Admitting: Radiology

## 2018-07-01 DIAGNOSIS — N471 Phimosis: Secondary | ICD-10-CM

## 2018-07-06 ENCOUNTER — Encounter
Admission: RE | Admit: 2018-07-06 | Discharge: 2018-07-06 | Disposition: A | Discharge status: Home / Self Care | Payer: Medicare Other | Source: Ambulatory Visit | Attending: Urology | Admitting: Urology

## 2018-07-06 ENCOUNTER — Other Ambulatory Visit: Payer: Self-pay

## 2018-07-06 ENCOUNTER — Telehealth: Payer: Self-pay | Admitting: Radiology

## 2018-07-06 DIAGNOSIS — Z01818 Encounter for other preprocedural examination: Secondary | ICD-10-CM | POA: Insufficient documentation

## 2018-07-06 HISTORY — DX: Type 2 diabetes mellitus without complications: E11.9

## 2018-07-06 HISTORY — DX: Unspecified intellectual disabilities: F79

## 2018-07-06 HISTORY — DX: Unspecified dementia, unspecified severity, without behavioral disturbance, psychotic disturbance, mood disturbance, and anxiety: F03.90

## 2018-07-06 NOTE — Telephone Encounter (Signed)
Returned sister, Jacelyn PiShelby's, phone call regarding lab results. LMOM to return call.

## 2018-07-06 NOTE — Patient Instructions (Signed)
Your procedure is scheduled on: Tuesday 07/13/18 Report to [facility]. To find out your arrival time please call [phone] between 1PM - 3PM on Monday 07/12/18.  Remember: Instructions that are not followed completely may result in serious medical risk, up to and including death, or upon the discretion of your surgeon and anesthesiologist your surgery may need to be rescheduled.     _X__ 1. Do not eat food after midnight the night before your procedure.                 No gum chewing or hard candies. You may drink clear liquids up to 2 hours                 before you are scheduled to arrive for your surgery- DO not drink clear                 liquids within 2 hours of the start of your surgery.                 Clear Liquids include:  water, apple juice without pulp, clear carbohydrate                 drink such as Clearfast or Gatorade, Black Coffee or Tea (Do not add                 anything to coffee or tea).  __X__2.  On the morning of surgery brush your teeth with toothpaste and water, you                 may rinse your mouth with mouthwash if you wish.  Do not swallow any              toothpaste of mouthwash.     _X__ 3.  No Alcohol for 24 hours before or after surgery.   _X__ 4.  Do Not Smoke or use e-cigarettes For 24 Hours Prior to Your Surgery.                 Do not use any chewable tobacco products for at least 6 hours prior to                 surgery.  ____  5.  Bring all medications with you on the day of surgery if instructed.   __X__  6.  Notify your doctor if there is any change in your medical condition      (cold, fever, infections).     Do not wear jewelry, make-up, hairpins, clips or nail polish. Do not wear lotions, powders, or perfumes.  Do not shave 48 hours prior to surgery. Men may shave face and neck. Do not bring valuables to the hospital.    [facility] is not responsible for any belongings or  valuables.  Contacts, dentures/partials or body piercings may not be worn into surgery. Bring a case for your contacts, glasses or hearing aids, a denture cup will be supplied. Leave your suitcase in the car. After surgery it may be brought to your room. For patients admitted to the hospital, discharge time is determined by your treatment team.   Patients discharged the day of surgery will not be allowed to drive home.   Please read over the following fact sheets that you were given:   MRSA Information  __X__ Take these medicines the morning of surgery with A SIP OF WATER:  1. citalopram (CELEXA)  2. finasteride (PROSCAR)  3. isosorbide mononitrate (IMDUR)   4. loratadine (CLARITIN)  5. pantoprazole (PROTONIX)  6. tamsulosin (FLOMAX)   7. ranolazine (RANEXA)  ____ Fleet Enema (as directed)   __X__ Use CHG Soap/SAGE wipes as directed  __X__ Use inhalers on the day of surgery  ELLIPTA AND ALBUTEROL  ____ Stop metformin/Janumet/Farxiga 2 days prior to surgery    ____ Take 1/2 of usual insulin dose the night before surgery. No insulin the morning          of surgery.   ____ Stop Blood Thinners Coumadin/Plavix/Xarelto/Pleta/Pradaxa/Eliquis/Effient/Aspirin  on   Or contact your Surgeon, Cardiologist or Medical Doctor regarding  ability to stop your blood thinners  __X__ Stop Anti-inflammatories 7 days before surgery such as Advil, Ibuprofen, Motrin,  BC or Goodies Powder, Naprosyn, Naproxen, Aleve, Aspirin    __X__ Stop all herbal supplements, fish oil or vitamin E until after surgery.    ____ Bring C-Pap to the hospital.

## 2018-07-12 MED ORDER — CEFAZOLIN SODIUM-DEXTROSE 2-4 GM/100ML-% IV SOLN: 2.0000 g | INTRAVENOUS | Status: DC

## 2018-07-13 ENCOUNTER — Encounter
Admission: RE | Disposition: A | Discharge status: Skilled Nursing Facility | Payer: Self-pay | Source: Ambulatory Visit | Attending: Urology

## 2018-07-13 ENCOUNTER — Other Ambulatory Visit: Payer: Self-pay | Admitting: Radiology

## 2018-07-13 ENCOUNTER — Ambulatory Visit
Admission: RE | Admit: 2018-07-13 | Discharge: 2018-07-13 | Disposition: A | Discharge status: Skilled Nursing Facility | Payer: Medicare Other | Source: Ambulatory Visit | Attending: Urology | Admitting: Urology

## 2018-07-13 DIAGNOSIS — N471 Phimosis: Secondary | ICD-10-CM

## 2018-07-13 SURGERY — CIRCUMCISION, ADULT
Anesthesia: Choice

## 2018-07-13 SURGICAL SUPPLY — 26 items
BLADE CLIPPER SURG (BLADE) ×3 IMPLANT
BLADE SURG 15 STRL LF DISP TIS (BLADE) ×1 IMPLANT
BLADE SURG 15 STRL SS (BLADE) ×2
BNDG COHESIVE 1X5 TAN NS LF (GAUZE/BANDAGES/DRESSINGS) IMPLANT
CANISTER SUCT 1200ML W/VALVE (MISCELLANEOUS) ×3 IMPLANT
CHLORAPREP W/TINT 26ML (MISCELLANEOUS) ×3 IMPLANT
DRAPE LAPAROTOMY 77X122 PED (DRAPES) ×3 IMPLANT
ELECT REM PT RETURN 9FT ADLT (ELECTROSURGICAL) ×3
ELECTRODE REM PT RTRN 9FT ADLT (ELECTROSURGICAL) ×1 IMPLANT
GAUZE PETROLATUM 1 X8 (GAUZE/BANDAGES/DRESSINGS) ×3 IMPLANT
GAUZE STRETCH 2X75IN STRL (MISCELLANEOUS) ×3 IMPLANT
GLOVE BIO SURGEON STRL SZ8 (GLOVE) ×3 IMPLANT
GOWN STRL REUS W/ TWL LRG LVL3 (GOWN DISPOSABLE) ×2 IMPLANT
GOWN STRL REUS W/TWL LRG LVL3 (GOWN DISPOSABLE) ×4
KIT TURNOVER KIT A (KITS) ×3 IMPLANT
LABEL OR SOLS (LABEL) ×3 IMPLANT
NEEDLE HYPO 25X1 1.5 SAFETY (NEEDLE) ×3 IMPLANT
NS IRRIG 500ML POUR BTL (IV SOLUTION) ×3 IMPLANT
PACK BASIN MINOR ARMC (MISCELLANEOUS) ×3 IMPLANT
SOL PREP PVP 2OZ (MISCELLANEOUS) ×3
SOLUTION PREP PVP 2OZ (MISCELLANEOUS) ×1 IMPLANT
STRETCH NET 2 107126 (MISCELLANEOUS) ×3 IMPLANT
SUT CHROMIC 3 0 SH 27 (SUTURE) ×3 IMPLANT
SUT CHROMIC 4 0 RB 1X27 (SUTURE) ×3 IMPLANT
SUT CHROMIC 4 0 SH 27 (SUTURE) ×3 IMPLANT
SYR 10ML LL (SYRINGE) ×3 IMPLANT

## 2018-07-13 NOTE — Pre-Procedure Instructions (Signed)
Pt was canceled and will be rescheduled per Dr Sampson GoonFitzgerald and Dr Lonna CobbStoioff. Pt left unit with guardian in wheelchair

## 2018-07-13 NOTE — Pre-Procedure Instructions (Signed)
Pt ate at least two bites of cereal with milk at 7am. Dr Sampson GoonFitzgerald from anesthesia notified.

## 2018-07-19 MED ORDER — CEFAZOLIN SODIUM-DEXTROSE 2-4 GM/100ML-% IV SOLN
2.0000 g | INTRAVENOUS | Status: AC
  Administered 2018-07-20 (×2): 2 g via INTRAVENOUS

## 2018-07-20 ENCOUNTER — Ambulatory Visit: Payer: Medicare Other | Admitting: Anesthesiology

## 2018-07-20 ENCOUNTER — Encounter
Admission: RE | Disposition: A | Discharge status: Home / Self Care | Payer: Self-pay | Source: Ambulatory Visit | Attending: Urology

## 2018-07-20 ENCOUNTER — Ambulatory Visit
Admission: RE | Admit: 2018-07-20 | Discharge: 2018-07-20 | Disposition: A | Discharge status: Home / Self Care | Payer: Medicare Other | Source: Ambulatory Visit | Attending: Urology | Admitting: Urology

## 2018-07-20 ENCOUNTER — Other Ambulatory Visit: Payer: Self-pay

## 2018-07-20 ENCOUNTER — Encounter: Payer: Self-pay | Admitting: *Deleted

## 2018-07-20 DIAGNOSIS — N401 Enlarged prostate with lower urinary tract symptoms: Secondary | ICD-10-CM | POA: Diagnosis not present

## 2018-07-20 DIAGNOSIS — I4891 Unspecified atrial fibrillation: Secondary | ICD-10-CM | POA: Diagnosis not present

## 2018-07-20 DIAGNOSIS — R3916 Straining to void: Secondary | ICD-10-CM | POA: Diagnosis not present

## 2018-07-20 DIAGNOSIS — R350 Frequency of micturition: Secondary | ICD-10-CM | POA: Insufficient documentation

## 2018-07-20 DIAGNOSIS — L859 Epidermal thickening, unspecified: Secondary | ICD-10-CM | POA: Insufficient documentation

## 2018-07-20 DIAGNOSIS — E119 Type 2 diabetes mellitus without complications: Secondary | ICD-10-CM | POA: Diagnosis not present

## 2018-07-20 DIAGNOSIS — K219 Gastro-esophageal reflux disease without esophagitis: Secondary | ICD-10-CM | POA: Diagnosis not present

## 2018-07-20 DIAGNOSIS — I509 Heart failure, unspecified: Secondary | ICD-10-CM | POA: Insufficient documentation

## 2018-07-20 DIAGNOSIS — J449 Chronic obstructive pulmonary disease, unspecified: Secondary | ICD-10-CM | POA: Insufficient documentation

## 2018-07-20 DIAGNOSIS — Z79899 Other long term (current) drug therapy: Secondary | ICD-10-CM | POA: Diagnosis not present

## 2018-07-20 DIAGNOSIS — R3915 Urgency of urination: Secondary | ICD-10-CM | POA: Insufficient documentation

## 2018-07-20 DIAGNOSIS — I110 Hypertensive heart disease with heart failure: Secondary | ICD-10-CM | POA: Insufficient documentation

## 2018-07-20 DIAGNOSIS — I252 Old myocardial infarction: Secondary | ICD-10-CM | POA: Diagnosis not present

## 2018-07-20 DIAGNOSIS — Z7951 Long term (current) use of inhaled steroids: Secondary | ICD-10-CM | POA: Diagnosis not present

## 2018-07-20 DIAGNOSIS — L988 Other specified disorders of the skin and subcutaneous tissue: Secondary | ICD-10-CM | POA: Diagnosis not present

## 2018-07-20 DIAGNOSIS — F0390 Unspecified dementia without behavioral disturbance: Secondary | ICD-10-CM | POA: Insufficient documentation

## 2018-07-20 DIAGNOSIS — N471 Phimosis: Secondary | ICD-10-CM | POA: Diagnosis not present

## 2018-07-20 DIAGNOSIS — I2510 Atherosclerotic heart disease of native coronary artery without angina pectoris: Secondary | ICD-10-CM | POA: Insufficient documentation

## 2018-07-20 DIAGNOSIS — Z87891 Personal history of nicotine dependence: Secondary | ICD-10-CM | POA: Insufficient documentation

## 2018-07-20 DIAGNOSIS — F419 Anxiety disorder, unspecified: Secondary | ICD-10-CM | POA: Insufficient documentation

## 2018-07-20 DIAGNOSIS — R351 Nocturia: Secondary | ICD-10-CM | POA: Insufficient documentation

## 2018-07-20 HISTORY — PX: CIRCUMCISION: SHX1350

## 2018-07-20 HISTORY — DX: Heart failure, unspecified: I50.9

## 2018-07-20 LAB — GLUCOSE, CAPILLARY
GLUCOSE-CAPILLARY: 89 mg/dL (ref 70–99)
Glucose-Capillary: 93 mg/dL (ref 70–99)

## 2018-07-20 SURGERY — CIRCUMCISION, ADULT
Anesthesia: General

## 2018-07-20 MED ORDER — SUCCINYLCHOLINE CHLORIDE 20 MG/ML IJ SOLN
INTRAMUSCULAR | Status: AC
  Filled 2018-07-20: qty 1

## 2018-07-20 MED ORDER — LIDOCAINE HCL (PF) 2 % IJ SOLN
INTRAMUSCULAR | Status: AC
  Filled 2018-07-20: qty 10

## 2018-07-20 MED ORDER — ROCURONIUM BROMIDE 50 MG/5ML IV SOLN
INTRAVENOUS | Status: AC
  Filled 2018-07-20: qty 1

## 2018-07-20 MED ORDER — DEXAMETHASONE SODIUM PHOSPHATE 10 MG/ML IJ SOLN
INTRAMUSCULAR | Status: AC
  Filled 2018-07-20: qty 1

## 2018-07-20 MED ORDER — BUPIVACAINE HCL (PF) 0.25 % IJ SOLN
INTRAMUSCULAR | Status: AC
  Filled 2018-07-20: qty 30

## 2018-07-20 MED ORDER — PROPOFOL 10 MG/ML IV BOLUS
INTRAVENOUS | Status: DC | PRN
  Administered 2018-07-20: 10 mg via INTRAVENOUS
  Administered 2018-07-20: 20 mg via INTRAVENOUS

## 2018-07-20 MED ORDER — LIDOCAINE HCL (PF) 1 % IJ SOLN
INTRAMUSCULAR | Status: AC
  Filled 2018-07-20: qty 30

## 2018-07-20 MED ORDER — PROPOFOL 10 MG/ML IV BOLUS
INTRAVENOUS | Status: AC
  Filled 2018-07-20: qty 20

## 2018-07-20 MED ORDER — BUPIVACAINE HCL 0.25 % IJ SOLN
INTRAMUSCULAR | Status: DC | PRN
  Administered 2018-07-20: 15 mL
  Administered 2018-07-20: 4 mL

## 2018-07-20 MED ORDER — ONDANSETRON HCL 4 MG/2ML IJ SOLN: 4.0000 mg | Freq: Once | INTRAMUSCULAR | Status: DC | PRN

## 2018-07-20 MED ORDER — FENTANYL CITRATE (PF) 100 MCG/2ML IJ SOLN: 25.0000 ug | INTRAMUSCULAR | Status: DC | PRN

## 2018-07-20 MED ORDER — ONDANSETRON HCL 4 MG/2ML IJ SOLN
INTRAMUSCULAR | Status: AC
  Filled 2018-07-20: qty 2

## 2018-07-20 MED ORDER — FENTANYL CITRATE (PF) 100 MCG/2ML IJ SOLN
INTRAMUSCULAR | Status: AC
  Filled 2018-07-20: qty 2

## 2018-07-20 MED ORDER — BACITRACIN ZINC 500 UNIT/GM EX OINT
TOPICAL_OINTMENT | CUTANEOUS | Status: AC
  Filled 2018-07-20: qty 28.35

## 2018-07-20 MED ORDER — BACITRACIN 500 UNIT/GM EX OINT
TOPICAL_OINTMENT | CUTANEOUS | Status: DC | PRN
  Administered 2018-07-20: 1 via TOPICAL

## 2018-07-20 MED ORDER — PROPOFOL 500 MG/50ML IV EMUL
INTRAVENOUS | Status: DC | PRN
  Administered 2018-07-20: 75 ug/kg/min via INTRAVENOUS

## 2018-07-20 MED ORDER — SULFAMETHOXAZOLE-TRIMETHOPRIM 800-160 MG PO TABS: 1.0000 | ORAL_TABLET | Freq: Two times a day (BID) | ORAL | 0 refills | Status: AC

## 2018-07-20 MED ORDER — TRAMADOL HCL 50 MG PO TABS: 50.0000 mg | ORAL_TABLET | Freq: Three times a day (TID) | ORAL | 0 refills | Status: AC

## 2018-07-20 MED ORDER — SODIUM CHLORIDE 0.9 % IV SOLN
INTRAVENOUS | Status: DC
  Administered 2018-07-20: 75 mL/h via INTRAVENOUS

## 2018-07-20 SURGICAL SUPPLY — 26 items
BLADE CLIPPER SURG (BLADE) ×4 IMPLANT
BLADE SURG 15 STRL LF DISP TIS (BLADE) ×2 IMPLANT
BLADE SURG 15 STRL SS (BLADE) ×2
BNDG COHESIVE 1X5 TAN NS LF (GAUZE/BANDAGES/DRESSINGS) IMPLANT
CANISTER SUCT 1200ML W/VALVE (MISCELLANEOUS) ×4 IMPLANT
CHLORAPREP W/TINT 26ML (MISCELLANEOUS) ×4 IMPLANT
DRAPE LAPAROTOMY 77X122 PED (DRAPES) ×4 IMPLANT
ELECT REM PT RETURN 9FT ADLT (ELECTROSURGICAL) ×4
ELECTRODE REM PT RTRN 9FT ADLT (ELECTROSURGICAL) ×2 IMPLANT
GAUZE PETROLATUM 1 X8 (GAUZE/BANDAGES/DRESSINGS) ×4 IMPLANT
GAUZE STRETCH 2X75IN STRL (MISCELLANEOUS) ×4 IMPLANT
GLOVE BIO SURGEON STRL SZ8 (GLOVE) ×4 IMPLANT
GOWN STRL REUS W/ TWL LRG LVL3 (GOWN DISPOSABLE) ×4 IMPLANT
GOWN STRL REUS W/TWL LRG LVL3 (GOWN DISPOSABLE) ×4
KIT TURNOVER KIT A (KITS) ×4 IMPLANT
LABEL OR SOLS (LABEL) ×4 IMPLANT
NEEDLE HYPO 25X1 1.5 SAFETY (NEEDLE) ×4 IMPLANT
NS IRRIG 500ML POUR BTL (IV SOLUTION) ×4 IMPLANT
PACK BASIN MINOR ARMC (MISCELLANEOUS) ×4 IMPLANT
SOL PREP PVP 2OZ (MISCELLANEOUS) ×4
SOLUTION PREP PVP 2OZ (MISCELLANEOUS) ×2 IMPLANT
STRETCH NET 2 107126 (MISCELLANEOUS) ×4 IMPLANT
SUT CHROMIC 3 0 SH 27 (SUTURE) ×4 IMPLANT
SUT CHROMIC 4 0 RB 1X27 (SUTURE) ×4 IMPLANT
SUT CHROMIC 4 0 SH 27 (SUTURE) ×4 IMPLANT
SYR 10ML LL (SYRINGE) ×4 IMPLANT

## 2018-07-20 NOTE — Anesthesia Preprocedure Evaluation (Addendum)
Anesthesia Evaluation  Patient identified by MRN, date of birth, ID band Patient awake    Reviewed: Allergy & Precautions, NPO status , Patient's Chart, lab work & pertinent test results  Airway Mallampati: III  TM Distance: >3 FB     Dental  (+) Upper Dentures, Lower Dentures   Pulmonary asthma , COPD, former smoker,    Pulmonary exam normal        Cardiovascular hypertension, + CAD, + Past MI and +CHF  Normal cardiovascular exam     Neuro/Psych PSYCHIATRIC DISORDERS Anxiety Dementia    GI/Hepatic GERD  Controlled,  Endo/Other  diabetes  Renal/GU Renal InsufficiencyRenal disease  negative genitourinary   Musculoskeletal   Abdominal Normal abdominal exam  (+)   Peds negative pediatric ROS (+)  Hematology negative hematology ROS (+)   Anesthesia Other Findings   Reproductive/Obstetrics                            Anesthesia Physical Anesthesia Plan  ASA: III  Anesthesia Plan: General   Post-op Pain Management:    Induction: Intravenous  PONV Risk Score and Plan: Propofol infusion and TIVA  Airway Management Planned: Nasal Cannula  Additional Equipment:   Intra-op Plan:   Post-operative Plan:   Informed Consent: I have reviewed the patients History and Physical, chart, labs and discussed the procedure including the risks, benefits and alternatives for the proposed anesthesia with the patient or authorized representative who has indicated his/her understanding and acceptance.   Dental advisory given  Plan Discussed with: CRNA and Surgeon  Anesthesia Plan Comments:        Anesthesia Quick Evaluation

## 2018-07-20 NOTE — Interval H&P Note (Signed)
History and Physical Interval Note: I reviewed Mr. Ralph Boyd's H&P.  He has lower urinary tract symptoms and phimosis.  His sister is his healthcare power of attorney/guardian.  She was inquiring about postoperative pain with a circumcision.  She was informed this can be significant for several days.  I also discussed the alternative of a dorsal slit which would involve less postoperative pain and a quicker recovery.  She would like to proceed with a dorsal slit.  She was also informed that procedures to resolve his phimosis may not improve his lower urinary tract symptoms as they may be secondary to BPH.  She indicated she understood and desires to proceed.  CV: RRR Lungs: Clear  07/20/2018 11:27 AM  Ralph Boyd  has presented today for surgery, with the diagnosis of phimosis  The various methods of treatment have been discussed with the patient and family. After consideration of risks, benefits and other options for treatment, the patient has consented to  Procedure(s): CIRCUMCISION ADULT (N/A) as a surgical intervention .  The patient's history has been reviewed, patient examined, no change in status, stable for surgery.  I have reviewed the patient's chart and labs.  Questions were answered to the patient's and guardian's satisfaction.     Scott C Stoioff

## 2018-07-20 NOTE — Anesthesia Post-op Follow-up Note (Signed)
Anesthesia QCDR form completed.        

## 2018-07-20 NOTE — Transfer of Care (Signed)
Immediate Anesthesia Transfer of Care Note  Patient: Ralph Boyd  Procedure(s) Performed: CIRCUMCISION ADULT (N/A ) DORSAL SLIT  Patient Location: PACU  Anesthesia Type:General  Level of Consciousness: awake and alert   Airway & Oxygen Therapy: Patient Spontanous Breathing  Post-op Assessment: Report given to RN and Post -op Vital signs reviewed and stable  Post vital signs: Reviewed and stable  Last Vitals:  Vitals Value Taken Time  BP 123/62 07/20/2018  1:03 PM  Temp    Pulse 70 07/20/2018  1:04 PM  Resp 16 07/20/2018  1:04 PM  SpO2 99 % 07/20/2018  1:04 PM  Vitals shown include unvalidated device data.  Last Pain:  Vitals:   07/20/18 0959  TempSrc: Oral  PainSc: 0-No pain      Patients Stated Pain Goal: 0 (07/20/18 0959)  Complications: No apparent anesthesia complications

## 2018-07-20 NOTE — Op Note (Signed)
Preoperative diagnosis:  1. Severe phimosis  Postoperative diagnosis:  1. Severe phimosis  Procedure: 1. Dorsal slit 2. Penile biopsy  Surgeon: Abbie Sons, MD  Anesthesia: MAC with penile block  Complications: None  Intraoperative findings: Severe phimosis with the glans adherent to the inner prepuce.  Marked inflammatory change, skin thickness and edema.  EBL: Minimal  Specimens: None  Indication: Ralph Boyd is a 82 y.o. patient with dementia seen by our PA for lower urinary tract symptoms and found to have severe phimosis.  He was initially scheduled for circumcision however and talking with his sister who is his legal guardian she would like a less invasive procedure and has requested a dorsal slit.  After reviewing the management options for treatment, he elected to proceed with the above surgical procedure(s). We have discussed the potential benefits and risks of the procedure, side effects of the proposed treatment, the likelihood of the patient achieving the goals of the procedure, and any potential problems that might occur during the procedure or recuperation. Informed consent has been obtained.  Description of procedure:  The patient was taken to the operating room and sedation was obtained by anesthesia.  The patient was placed in the supine position, prepped and draped in the usual sterile fashion, and preoperative antibiotics were administered.  A dorsal penile block was performed with a combination of 1% plain Xylocaine and 0.25% plain bupivacaine.  A preoperative time-out was performed.   Severe phimosis was present and the meatus could not be visualized.  The dorsal preputial tissue was clamped with a Kocher and incised.  After the initial incision the glans was not visualized.  The glans appeared to be adherent to the inner prepuce.  A plane was developed with hemostats and once developed the dorsal prepuce was further clamped and divided to expose the  glans.  There were severe inflammatory changes with a mottled appearance and whitish plaques on the glans and distal shaft skin.  A biopsy was performed and the site closed with interrupted 4-0 chromic suture.  The skin edges reapproximated with interrupted 3-0 chromic suture.  A dressing of Vaseline gauze, Kling and stretch net was applied.  The patient was transported to the PACU and stable condition.  Abbie Sons, M.D.

## 2018-07-20 NOTE — OR Nursing (Signed)
Patient had large liquid stool and voided.  Cleaned up

## 2018-07-21 NOTE — Anesthesia Postprocedure Evaluation (Signed)
Anesthesia Post Note  Patient: Ralph Boyd  Procedure(s) Performed: CIRCUMCISION ADULT (N/A ) DORSAL SLIT  Patient location during evaluation: PACU Anesthesia Type: General Level of consciousness: awake and alert and oriented Pain management: pain level controlled Vital Signs Assessment: post-procedure vital signs reviewed and stable Respiratory status: spontaneous breathing Cardiovascular status: blood pressure returned to baseline Anesthetic complications: no     Last Vitals:  Vitals:   07/20/18 1318 07/20/18 1328  BP: 133/62 (!) 150/76  Pulse: 67 70  Resp: 12 16  Temp:  (!) 36.3 C  SpO2: 99% 98%    Last Pain:  Vitals:   07/20/18 1328  TempSrc: Temporal  PainSc: 0-No pain                 Jakeline Dave

## 2018-07-22 LAB — SURGICAL PATHOLOGY

## 2018-07-23 ENCOUNTER — Other Ambulatory Visit: Payer: Self-pay

## 2018-07-23 ENCOUNTER — Emergency Department: Payer: Medicare Other

## 2018-07-23 ENCOUNTER — Encounter: Payer: Self-pay | Admitting: Emergency Medicine

## 2018-07-23 ENCOUNTER — Emergency Department
Admission: EM | Admit: 2018-07-23 | Discharge: 2018-07-23 | Disposition: A | Discharge status: Home / Self Care | Payer: Medicare Other | Attending: Emergency Medicine | Admitting: Emergency Medicine

## 2018-07-23 DIAGNOSIS — Y658 Other specified misadventures during surgical and medical care: Secondary | ICD-10-CM | POA: Diagnosis not present

## 2018-07-23 DIAGNOSIS — F17200 Nicotine dependence, unspecified, uncomplicated: Secondary | ICD-10-CM | POA: Insufficient documentation

## 2018-07-23 DIAGNOSIS — I110 Hypertensive heart disease with heart failure: Secondary | ICD-10-CM | POA: Diagnosis not present

## 2018-07-23 DIAGNOSIS — Z79899 Other long term (current) drug therapy: Secondary | ICD-10-CM | POA: Diagnosis not present

## 2018-07-23 DIAGNOSIS — E119 Type 2 diabetes mellitus without complications: Secondary | ICD-10-CM | POA: Insufficient documentation

## 2018-07-23 DIAGNOSIS — L0390 Cellulitis, unspecified: Secondary | ICD-10-CM

## 2018-07-23 DIAGNOSIS — T819XXA Unspecified complication of procedure, initial encounter: Secondary | ICD-10-CM | POA: Diagnosis not present

## 2018-07-23 DIAGNOSIS — N4822 Cellulitis of corpus cavernosum and penis: Secondary | ICD-10-CM | POA: Insufficient documentation

## 2018-07-23 DIAGNOSIS — F0390 Unspecified dementia without behavioral disturbance: Secondary | ICD-10-CM | POA: Insufficient documentation

## 2018-07-23 DIAGNOSIS — I509 Heart failure, unspecified: Secondary | ICD-10-CM | POA: Diagnosis not present

## 2018-07-23 DIAGNOSIS — Z7982 Long term (current) use of aspirin: Secondary | ICD-10-CM | POA: Diagnosis not present

## 2018-07-23 DIAGNOSIS — J45909 Unspecified asthma, uncomplicated: Secondary | ICD-10-CM | POA: Insufficient documentation

## 2018-07-23 DIAGNOSIS — N4889 Other specified disorders of penis: Secondary | ICD-10-CM | POA: Diagnosis present

## 2018-07-23 LAB — CBC WITH DIFFERENTIAL/PLATELET
Basophils Absolute: 0 10*3/uL (ref 0–0.1)
Basophils Relative: 1 %
EOS ABS: 0.2 10*3/uL (ref 0–0.7)
EOS PCT: 5 %
HCT: 34.2 % — ABNORMAL LOW (ref 40.0–52.0)
HEMOGLOBIN: 11.8 g/dL — AB (ref 13.0–18.0)
LYMPHS ABS: 1.2 10*3/uL (ref 1.0–3.6)
Lymphocytes Relative: 24 %
MCH: 31.2 pg (ref 26.0–34.0)
MCHC: 34.6 g/dL (ref 32.0–36.0)
MCV: 90.2 fL (ref 80.0–100.0)
MONOS PCT: 9 %
Monocytes Absolute: 0.5 10*3/uL (ref 0.2–1.0)
NEUTROS PCT: 61 %
Neutro Abs: 3 10*3/uL (ref 1.4–6.5)
Platelets: 166 10*3/uL (ref 150–440)
RBC: 3.78 MIL/uL — ABNORMAL LOW (ref 4.40–5.90)
RDW: 15.8 % — ABNORMAL HIGH (ref 11.5–14.5)
WBC: 4.9 10*3/uL (ref 3.8–10.6)

## 2018-07-23 LAB — BASIC METABOLIC PANEL
Anion gap: 8 (ref 5–15)
BUN: 34 mg/dL — AB (ref 8–23)
CO2: 26 mmol/L (ref 22–32)
CREATININE: 1.59 mg/dL — AB (ref 0.61–1.24)
Calcium: 8.6 mg/dL — ABNORMAL LOW (ref 8.9–10.3)
Chloride: 99 mmol/L (ref 98–111)
GFR calc Af Amer: 46 mL/min — ABNORMAL LOW (ref >60)
GFR calc non Af Amer: 39 mL/min — ABNORMAL LOW (ref >60)
GLUCOSE: 191 mg/dL — AB (ref 70–99)
Potassium: 3.9 mmol/L (ref 3.5–5.1)
SODIUM: 133 mmol/L — AB (ref 135–145)

## 2018-07-23 MED ORDER — CEPHALEXIN 500 MG PO CAPS
500.0000 mg | ORAL_CAPSULE | Freq: Once | ORAL | Status: AC
  Administered 2018-07-23: 500 mg via ORAL
  Filled 2018-07-23: qty 1

## 2018-07-23 MED ORDER — NYSTATIN 100000 UNIT/GM EX POWD: Freq: Four times a day (QID) | CUTANEOUS | 0 refills | Status: DC

## 2018-07-23 MED ORDER — BENZONATATE 100 MG PO CAPS: ORAL_CAPSULE | ORAL | 0 refills | Status: AC

## 2018-07-23 MED ORDER — NYSTATIN 100000 UNIT/GM EX POWD: Freq: Four times a day (QID) | CUTANEOUS | 0 refills | Status: AC

## 2018-07-23 MED ORDER — CEPHALEXIN 500 MG PO CAPS: 500.0000 mg | ORAL_CAPSULE | Freq: Two times a day (BID) | ORAL | 0 refills | Status: AC

## 2018-07-23 MED ORDER — IPRATROPIUM-ALBUTEROL 0.5-2.5 (3) MG/3ML IN SOLN
3.0000 mL | Freq: Once | RESPIRATORY_TRACT | Status: AC
  Administered 2018-07-23: 3 mL via RESPIRATORY_TRACT
  Filled 2018-07-23: qty 3

## 2018-07-23 NOTE — ED Triage Notes (Addendum)
Pt had partial circumcision Tuesday this week and sister is concerned about appearance.  Skin appears swollen up around penis and somewhat red with a while/yellow type drainage/covering to penis area.  Difficult to assess in triage. VSS. Dementia at baseline.  Discussed with dr Pershing Proudschaevitz

## 2018-07-23 NOTE — ED Notes (Signed)
Patient transported to X-ray 

## 2018-07-23 NOTE — ED Notes (Signed)
ED Provider at bedside. 

## 2018-07-23 NOTE — ED Notes (Signed)
Patient post circumcision on Tuesday. Patient with swelling noted to penis. Sutures intact and well approximated. Patient with some redness and white area noted.

## 2018-07-23 NOTE — ED Notes (Signed)
Patient assisted with urinal   

## 2018-07-23 NOTE — Discharge Instructions (Addendum)
Please follow-up with urology for recheck/reevaluation on Tuesday.  Return to the emergency department for any apparent worsening redness swelling or any other symptom personally concerning to yourself.

## 2018-07-23 NOTE — ED Provider Notes (Signed)
[facility] Emergency Department Provider Note  Time seen: 8:07 PM  I have reviewed the triage vital signs and the nursing notes.   HISTORY  Chief Complaint Post-op Problem    HPI Ralph Boyd is a 82 y.o. male with a past medical history of asthma, anxiety, dementia, diabetes, hypertension, presents to the emergency department for evaluation of his genitals.  According to the wife patient had a partial circumcision performed on Tuesday due to difficulty urinating.  She states the penis looks more inflamed and red, she was concerned so she brought the patient to the emergency department for evaluation.  Patient has baseline dementia and cannot contribute to his history or review of systems.  Wife denies any known fever.   Past Medical History:  Diagnosis Date  . Acid reflux   . Anxiety   . Asthma   . Atrial fibrillation [facility])    family unaware of this diagnosis  . BPH (benign prostatic hyperplasia)   . CHF (congestive heart failure) (HCC)   . Dementia   . Diabetes mellitus without complication (HCC)    diet controlled  . Heart disease   . HLD (hyperlipidemia)   . HTN (hypertension)   . Kidney disease   . Mental disability   . MI (myocardial infarction) [facility])    2012    Patient Active Problem List   Diagnosis Date Noted  . Acute gastroenteritis 01/17/2018  . Arteriosclerosis of coronary artery 07/27/2015  . Diabetes mellitus, type 2 (HCC) 07/27/2015  . Hypercholesteremia 07/27/2015  . BP (high blood pressure) 07/27/2015  . Intellectual disability 07/13/2015  . COPD, moderate (HCC) 07/28/2014    Past Surgical History:  Procedure Laterality Date  . CIRCUMCISION N/A 07/20/2018   Procedure: CIRCUMCISION ADULT;  Surgeon: Riki Altes, MD;  Location: [facility] ORS;  Service: Urology;  Laterality: N/A;  . HERNIA REPAIR  1990    Prior to Admission medications   Medication Sig Start Date End Date Taking? Authorizing Provider   acetaminophen (TYLENOL) 325 MG tablet Take 650 mg by mouth every 4 (four) hours as needed for mild pain or fever.     [provider]  albuterol (PROVENTIL HFA;VENTOLIN HFA) 108 (90 Base) MCG/ACT inhaler Inhale 2 puffs into the lungs every 4 (four) hours as needed for wheezing or shortness of breath.    [provider]  alum & mag hydroxide-simeth (MINTOX) 200-200-20 MG/5ML suspension Take 30 mLs by mouth 4 (four) times daily as needed for indigestion or heartburn.    [provider]  aspirin 81 MG chewable tablet Chew 81 mg by mouth daily.  02/12/15   [provider]  barrier cream (NON-SPECIFIED) CREA Apply 1 application topically See admin instructions. Apply to buttocks twice daily, may apply after toileting as needed to prevent skin breakdown    [provider]  benzonatate (TESSALON) 100 MG capsule Take 100 mg by mouth 3 (three) times daily as needed for cough.    [provider]  citalopram (CELEXA) 10 MG tablet Take 10 mg by mouth daily.     [provider]  CRANBERRY JUICE EXTRACT PO Take 8 oz by mouth daily.    [provider]  Dextromethorphan-guaiFENesin (ROBITUSSIN DM PO) Take 5 mLs by mouth every 6 (six) hours as needed (cough).    [provider]  diphenhydrAMINE (BENADRYL) 25 MG tablet Take 25 mg by mouth every 6 (six) hours as needed for allergies.    [provider]  finasteride (PROSCAR)  5 MG tablet Take 5 mg by mouth daily.  02/12/15   [provider]  fluticasone (FLONASE) 50 MCG/ACT nasal spray Place 2 sprays into both nostrils daily.    [provider]  fluticasone-salmeterol (ADVAIR HFA) [phone] MCG/ACT inhaler Inhale 2 puffs into the lungs 2 (two) times daily.  02/12/15   [provider]  furosemide (LASIX) 40 MG tablet Take 40 mg by mouth 2 (two) times daily.    [provider]  guaiFENesin (ROBITUSSIN) 100 MG/5ML liquid Take 300 mg by mouth every 6 (six)  hours as needed for cough.     [provider]  hydrocortisone 2.5 % cream Apply 1 application topically 2 (two) times daily. (to facial rash until healed)    [provider]  isosorbide mononitrate (IMDUR) 60 MG 24 hr tablet Take 60 mg by mouth 2 (two) times daily.  02/12/15   [provider]  loperamide (IMODIUM A-D) 2 MG tablet Take 2-4 mg by mouth as needed for diarrhea or loose stools. (max 8 doses per 24 hours)    [provider]  loratadine (CLARITIN) 10 MG tablet Take 10 mg by mouth daily.    [provider]  losartan (COZAAR) 50 MG tablet Take 50 mg by mouth daily.    [provider]  magnesium hydroxide (MILK OF MAGNESIA) 400 MG/5ML suspension Take 30 mLs by mouth daily as needed for mild constipation.    [provider]  menthol-zinc oxide (GOLD BOND) powder Apply 1 application topically daily as needed (dry itchy skin).    [provider]  mineral oil external liquid Apply 1 application topically daily. Apply to flaky spots on forehead and ears    [provider]  mirtazapine (REMERON) 30 MG tablet Take 30 mg by mouth at bedtime.    [provider]  nitroGLYCERIN (NITROSTAT) 0.4 MG SL tablet Place 0.4 mg under the tongue every 5 (five) minutes as needed for chest pain.     [provider]  Nutritional Supplements (NUTRITIONAL DRINK) LIQD Take 1 Dose by mouth 3 (three) times daily with meals. [employer]    [provider]  nystatin (MYCOSTATIN/NYSTOP) powder Apply 1 g topically 2 (two) times daily. (to groin area until healed)    [provider]  pantoprazole (PROTONIX) 40 MG tablet Take 40 mg by mouth 2 (two) times daily.    [provider]  polyethylene glycol (MIRALAX / GLYCOLAX) packet Take 17 g by mouth daily. 01/22/18   Emily FilbertWilliams, Jonathan E, MD  Pramoxine-Menthol-Dimethicone (GOLD BOND MEDICATED ANTI [facility] EX) Apply 1 application topically 2 (two) times daily. To  legs    [provider]  pyrithione zinc (HEAD AND SHOULDERS) 1 % shampoo Apply 1 application topically daily as needed (dandruff).    [provider]  ranolazine (RANEXA) 500 MG 12 hr tablet Take 500 mg by mouth 2 (two) times daily.    [provider]  Skin Protectants, Misc. (BAZA PROTECT EX) Apply 1 application topically 3 (three) times daily.    [provider]  Skin Protectants, Misc. (HYDROCERIN EX) Apply 1 application topically 2 (two) times daily. To face    [provider]  sulfamethoxazole-trimethoprim (BACTRIM DS,SEPTRA DS) 800-160 MG tablet Take 1 tablet by mouth 2 (two) times daily for 7 days. 07/20/18 07/27/18  Stoioff, Verna CzechScott C, MD  tamsulosin (FLOMAX) 0.4 MG CAPS capsule Take 1 capsule (0.4 mg total) by mouth daily. 07/03/15   Crist FatHerrick, Benjamin W, MD  traMADol Janean Sark(ULTRAM)  50 MG tablet Take 1 tablet (50 mg total) by mouth 3 (three) times daily. Give 3 times daily x48 hours then as needed.  Hold for increased somnolence 07/20/18   Stoioff, Verna Czech, MD  triamcinolone cream (KENALOG) 0.1 % Apply 1 application topically 2 (two) times daily as needed ((itching or rash on scrotum)).    [provider]  umeclidinium bromide (INCRUSE ELLIPTA) 62.5 MCG/INH AEPB Inhale 1 puff into the lungs daily.    [provider]    No Known Allergies  History reviewed. No pertinent family history.  Social History Social History   Tobacco Use  . Smoking status: Former Games developer  . Smokeless tobacco: Never Used  . Tobacco comment: Quit 6 years  Substance Use Topics  . Alcohol use: No    Alcohol/week: 0.0 standard drinks  . Drug use: No    Review of Systems Unable to obtain an adequate/accurate review of systems secondary to baseline dementia  ____________________________________________   PHYSICAL EXAM:  VITAL SIGNS: ED Triage Vitals  Enc Vitals Group     BP 07/23/18 1650 93/74     Pulse Rate 07/23/18 1650 82     Resp 07/23/18 1650 18      Temp 07/23/18 1650 98 F (36.7 C)     Temp Source 07/23/18 1650 Oral     SpO2 07/23/18 1650 94 %     Weight 07/23/18 1651 182 lb 15.7 oz (83 kg)     Height 07/23/18 1651 5\' 7"  (1.702 m)     Head Circumference --      Peak Flow --      Pain Score --      Pain Loc --      Pain Edu? --      Excl. in GC? --    Constitutional: Alert, calm and cooperative, confused but this is baseline. Eyes: Normal exam ENT   Head: Normocephalic and atraumatic.   Mouth/Throat: Mucous membranes are moist. Cardiovascular: Normal rate, regular rhythm.  Respiratory: Normal respiratory effort without tachypnea nor retractions.  Very slight expiratory wheeze bilaterally. Gastrointestinal: Soft and nontender. No distention. Genitourinary: Patient has sutures present to surgical incision on his penis/foreskin.  Appears more consistent with a dorsal slit.  There is moderate erythema of the glans of the penis with small white patches possibly indicating mild fungal infection.  The incisions himself appear well, no dehiscence no significant drainage. Musculoskeletal: Nontender with normal range of motion in all extremities. Neurologic:  Normal speech and language. No gross focal neurologic deficits Skin:  Skin is warm.  Genital exam described above. Psychiatric: Mood and affect are normal for patient and situation.  ____________________________________________   INITIAL IMPRESSION / ASSESSMENT AND PLAN / ED COURSE  Pertinent labs & imaging results that were available during my care of the patient were reviewed by me and considered in my medical decision making (see chart for details).  Patient presents emergency department for evaluation of his genitals.  I reviewed the patient's records on 07/20/2018 patient underwent a dorsal slit for phimosis as well as a penile biopsy.  In the operative note they do note severe phimosis with the glands adherent to the foreskin and market inflammatory changes.  This  appears very consistent with today's examination as well.  Given the erythema with mild white patches I will prescribe a nystatin powder to apply topically.  We will also prescribe Keflex as a precaution for any possible underlying infection.  Wife also states today the patient has been  coughing and appears to have a slight wheeze.  We will treat with a breathing treatment in the emergency department and obtain a chest x-ray as a precaution.  Reassuringly patient is afebrile labs are reassuring with a normal white blood cell count.  Chest x-ray is negative for acute abnormality.  Unable to exclude mild pulmonary edema.  ____________________________________________   FINAL CLINICAL IMPRESSION(S) / ED DIAGNOSES  Wound evaluation Asthma    Minna Antis, MD 07/23/18 2221

## 2018-07-27 ENCOUNTER — Encounter: Payer: Self-pay | Admitting: Urology

## 2018-07-27 ENCOUNTER — Ambulatory Visit (INDEPENDENT_AMBULATORY_CARE_PROVIDER_SITE_OTHER): Payer: Medicare Other | Admitting: Urology

## 2018-07-27 ENCOUNTER — Other Ambulatory Visit: Payer: Self-pay

## 2018-07-27 VITALS — BP 127/66 | HR 77 | Ht 68.0 in | Wt 180.0 lb

## 2018-07-27 DIAGNOSIS — N471 Phimosis: Secondary | ICD-10-CM

## 2018-07-27 NOTE — [facility] Notes (Signed)
07/27/2018 11:15 AM   Quillian Quince [facility] 06/15/1936 [id number]  Referring provider: [facility] [address] [place], [place] [address]  Chief Complaint  Patient presents with  . Phimosis    HPI: Patient is an 82 year old Caucasian male with MR and a history of phimosis who underwent dorsal slit and penile biopsy on 07/20/2018.  Background history Patient is an 82 year old Caucasian male with MR who presents with his sister, [name], for a recheck.  History is obtained from sister.  He was last seen in our office in 2017 for the complaint of spots of blood found in his underpants.  He was not found to have blood on microscopic analysis of his urine and his sister and caregivers were to report back to the office if they should see any gross hematuria with Mr. Lauer.  His sister states that she has not noted any further blood in the urine, but her concern today is he is constantly in the restroom urinating and taking a long time to urinate.  She has noticed this as well as the staff at the [facility] [address]  She carried him to his primary care and had a urine checked and was told there was no infection.  His PVR today is  71 mL.    Penile biopsy was negative for malignancy.    He was seen in the ED on 07/23/2018 for concerns of post operative problems.  Patient was examined and his exam was consistent with normal post-operative changes.  He was given Nystatin powder and placed on Keflex.  He had been placed on Septra DS after his procedure.  He is currently on Septra DS only per MAR and Bacitracin.    PMH: Past Medical History:  Diagnosis Date  . Acid reflux   . Anxiety   . Asthma   . Atrial fibrillation [facility])    family unaware of this diagnosis  . BPH (benign prostatic hyperplasia)   . CHF (congestive heart failure) (HCC)   . Dementia   . Diabetes mellitus without complication (HCC)    diet controlled  . Heart disease   . HLD  (hyperlipidemia)   . HTN (hypertension)   . Kidney disease   . Mental disability   . MI (myocardial infarction) [facility])    2012    Surgical History: Past Surgical History:  Procedure Laterality Date  . CIRCUMCISION N/A 07/20/2018   Procedure: CIRCUMCISION ADULT;  Surgeon: Riki Altes, MD;  Location: [facility] ORS;  Service: Urology;  Laterality: N/A;  . HERNIA REPAIR  1990    Home Medications:  Allergies as of 07/27/2018   No Known Allergies     Medication List        Accurate as of 07/27/18 11:15 AM. Always use your most recent med list.          acetaminophen 325 MG tablet Commonly known as:  TYLENOL Take 650 mg by mouth every 4 (four) hours as needed for mild pain or fever.   ADVAIR HFA 45-21 MCG/ACT inhaler Generic drug:  fluticasone-salmeterol Inhale 2 puffs into the lungs 2 (two) times daily.   albuterol 108 (90 Base) MCG/ACT inhaler Commonly known as:  PROVENTIL HFA;VENTOLIN HFA Inhale 2 puffs into the lungs every 4 (four) hours as needed for wheezing or shortness of breath.   aspirin 81 MG chewable tablet Chew 81 mg by mouth daily.   barrier cream Crea Commonly known as:  non-specified Apply 1 application topically See admin  instructions. Apply to buttocks twice daily, may apply after toileting as needed to prevent skin breakdown   benzonatate 100 MG capsule Commonly known as:  TESSALON Please dose 1 capsule every 6 hours for the first 48 hours and then use every 6 hours as needed for cough after that.   cephALEXin 500 MG capsule Commonly known as:  KEFLEX Take 1 capsule (500 mg total) by mouth 2 (two) times daily.   citalopram 10 MG tablet Commonly known as:  CELEXA Take 10 mg by mouth daily.   CRANBERRY JUICE EXTRACT PO Take 8 oz by mouth daily.   diphenhydrAMINE 25 MG tablet Commonly known as:  BENADRYL Take 25 mg by mouth every 6 (six) hours as needed for allergies.   finasteride 5 MG tablet Commonly known as:  PROSCAR Take 5 mg by mouth  daily.   fluticasone 50 MCG/ACT nasal spray Commonly known as:  FLONASE Place 2 sprays into both nostrils daily.   furosemide 40 MG tablet Commonly known as:  LASIX Take 40 mg by mouth 2 (two) times daily.   GOLD BOND MEDICATED ANTI ITCH EX Apply 1 application topically 2 (two) times daily. To legs   guaiFENesin 100 MG/5ML liquid Commonly known as:  ROBITUSSIN Take 300 mg by mouth every 6 (six) hours as needed for cough.   HYDROCERIN EX Apply 1 application topically 2 (two) times daily. To face   BAZA PROTECT EX Apply 1 application topically 3 (three) times daily.   hydrocortisone 2.5 % cream Apply 1 application topically 2 (two) times daily. (to facial rash until healed)   INCRUSE ELLIPTA 62.5 MCG/INH Aepb Generic drug:  umeclidinium bromide Inhale 1 puff into the lungs daily.   isosorbide mononitrate 60 MG 24 hr tablet Commonly known as:  IMDUR Take 60 mg by mouth 2 (two) times daily.   loperamide 2 MG tablet Commonly known as:  IMODIUM A-D Take 2-4 mg by mouth as needed for diarrhea or loose stools. (max 8 doses per 24 hours)   loratadine 10 MG tablet Commonly known as:  CLARITIN Take 10 mg by mouth daily.   losartan 50 MG tablet Commonly known as:  COZAAR Take 50 mg by mouth daily.   magnesium hydroxide 400 MG/5ML suspension Commonly known as:  MILK OF MAGNESIA Take 30 mLs by mouth daily as needed for mild constipation.   menthol-zinc oxide powder Apply 1 application topically daily as needed (dry itchy skin).   mineral oil external liquid Apply 1 application topically daily. Apply to flaky spots on forehead and ears   MINTOX 200-200-20 MG/5ML suspension Generic drug:  alum & mag hydroxide-simeth Take 30 mLs by mouth 4 (four) times daily as needed for indigestion or heartburn.   mirtazapine 30 MG tablet Commonly known as:  REMERON Take 30 mg by mouth at bedtime.   nitroGLYCERIN 0.4 MG SL tablet Commonly known as:  NITROSTAT Place 0.4 mg under the  tongue every 5 (five) minutes as needed for chest pain.   NUTRITIONAL DRINK Liqd Take 1 Dose by mouth 3 (three) times daily with meals. House Shakes   nystatin powder Commonly known as:  MYCOSTATIN/NYSTOP Apply topically 4 (four) times daily.   pantoprazole 40 MG tablet Commonly known as:  PROTONIX Take 40 mg by mouth 2 (two) times daily.   polyethylene glycol packet Commonly known as:  MIRALAX / GLYCOLAX Take 17 g by mouth daily.   pyrithione zinc 1 % shampoo Commonly known as:  HEAD AND SHOULDERS Apply 1 application topically  daily as needed (dandruff).   ranolazine 500 MG 12 hr tablet Commonly known as:  RANEXA Take 500 mg by mouth 2 (two) times daily.   ROBITUSSIN DM PO Take 5 mLs by mouth every 6 (six) hours as needed (cough).   sulfamethoxazole-trimethoprim 800-160 MG tablet Commonly known as:  BACTRIM DS,SEPTRA DS Take 1 tablet by mouth 2 (two) times daily for 7 days.   tamsulosin 0.4 MG Caps capsule Commonly known as:  FLOMAX Take 1 capsule (0.4 mg total) by mouth daily.   traMADol 50 MG tablet Commonly known as:  ULTRAM Take 1 tablet (50 mg total) by mouth 3 (three) times daily. Give 3 times daily x48 hours then as needed.  Hold for increased somnolence   triamcinolone cream 0.1 % Commonly known as:  KENALOG Apply 1 application topically 2 (two) times daily as needed ((itching or rash on scrotum)).       Allergies: No Known Allergies  Family History: History reviewed. No pertinent family history.  Social History:  reports that he has quit smoking. He has never used smokeless tobacco. He reports that he does not drink alcohol or use drugs.  ROS: UROLOGY Frequent Urination?: Yes Hard to postpone urination?: Yes Burning/pain with urination?: No Get up at night to urinate?: Yes Leakage of urine?: Yes Urine stream starts and stops?: No Trouble starting stream?: Yes Do you have to strain to urinate?: No Blood in urine?: No Urinary tract infection?:  No Sexually transmitted disease?: No Injury to kidneys or bladder?: No Painful intercourse?: No Weak stream?: No Erection problems?: No Penile pain?: No  Gastrointestinal Nausea?: No Vomiting?: No Indigestion/heartburn?: Yes Diarrhea?: No Constipation?: No  Constitutional Fever: No Night sweats?: No Weight loss?: No Fatigue?: No  Skin Skin rash/lesions?: No Itching?: No  Eyes Blurred vision?: No Double vision?: No  Ears/Nose/Throat Sore throat?: No Sinus problems?: Yes  Hematologic/Lymphatic Swollen glands?: No Easy bruising?: No  Cardiovascular Leg swelling?: Yes Chest pain?: No  Respiratory Cough?: Yes Shortness of breath?: Yes  Endocrine Excessive thirst?: No  Musculoskeletal Back pain?: No Joint pain?: No  Neurological Headaches?: No Dizziness?: No  Psychologic Depression?: Yes Anxiety?: Yes  Physical Exam: BP 127/66   Pulse 77   Ht 5\' 8"  (1.727 m)   Wt 180 lb (81.6 kg)   BMI 27.37 kg/m   Constitutional: Well nourished. Alert and oriented, No acute distress. HEENT: [place] AT, moist mucus membranes. Trachea midline, no masses. Cardiovascular: No clubbing, cyanosis, or edema. Respiratory: Normal respiratory effort, no increased work of breathing. GI: Abdomen is soft, non tender, non distended, no abdominal masses. Liver and spleen not palpable.  No hernias appreciated.  Stool sample for occult testing is not indicated.   GU: No CVA tenderness.  No bladder fullness or masses.  Penis with dorsal slit.  Incisions clean and dry.  Ulcerative type lesions seen on the foreskin.   Skin: No rashes, bruises or suspicious lesions. Lymph: No cervical or inguinal adenopathy. Neurologic: Grossly intact, no focal deficits, moving all 4 extremities. Psychiatric: Normal mood and affect.  Laboratory Data: Lab Results  Component Value Date   WBC 4.9 07/23/2018   HGB 11.8 (L) 07/23/2018   HCT 34.2 (L) 07/23/2018   MCV 90.2 07/23/2018   PLT 166  07/23/2018    Lab Results  Component Value Date   CREATININE 1.59 (H) 07/23/2018    Lab Results  Component Value Date   AST 51 (H) 01/22/2018   Lab Results  Component Value Date   ALT  41 01/22/2018   PSA history  1.5 ng/mL on 07/03/2015  Results for orders placed or performed during the hospital encounter of 07/23/18  CBC with Differential  Result Value Ref Range   WBC 4.9 3.8 - 10.6 K/uL   RBC 3.78 (L) 4.40 - 5.90 MIL/uL   Hemoglobin 11.8 (L) 13.0 - 18.0 g/dL   HCT 16.1 (L) 09.6 - 04.5 %   MCV 90.2 80.0 - 100.0 fL   MCH 31.2 26.0 - 34.0 pg   MCHC 34.6 32.0 - 36.0 g/dL   RDW 40.9 (H) 81.1 - 91.4 %   Platelets 166 150 - 440 K/uL   Neutrophils Relative % 61 %   Neutro Abs 3.0 1.4 - 6.5 K/uL   Lymphocytes Relative 24 %   Lymphs Abs 1.2 1.0 - 3.6 K/uL   Monocytes Relative 9 %   Monocytes Absolute 0.5 0.2 - 1.0 K/uL   Eosinophils Relative 5 %   Eosinophils Absolute 0.2 0 - 0.7 K/uL   Basophils Relative 1 %   Basophils Absolute 0.0 0 - 0.1 K/uL  Basic metabolic panel  Result Value Ref Range   Sodium 133 (L) 135 - 145 mmol/L   Potassium 3.9 3.5 - 5.1 mmol/L   Chloride 99 98 - 111 mmol/L   CO2 26 22 - 32 mmol/L   Glucose, Bld 191 (H) 70 - 99 mg/dL   BUN 34 (H) 8 - 23 mg/dL   Creatinine, Ser 7.82 (H) 0.61 - 1.24 mg/dL   Calcium 8.6 (L) 8.9 - 10.3 mg/dL   GFR calc non Af Amer 39 (L) >60 mL/min   GFR calc Af Amer 46 (L) >60 mL/min   Anion gap 8 5 - 15   I have reviewed the labs.  Assessment & Plan:    1. Phimosis Progressing as expected RTC in 2 weeks for recheck Continue the ointment and antibiotic   Return in about 2 weeks (around 08/10/2018) for Recheck .  These notes generated with voice recognition software. I apologize for typographical errors.  Michiel Cowboy, PA-C  [facility] [address] [place], [place] [address] [phone]

## 2018-08-10 ENCOUNTER — Ambulatory Visit (INDEPENDENT_AMBULATORY_CARE_PROVIDER_SITE_OTHER): Payer: Medicare Other | Admitting: Urology

## 2018-08-10 ENCOUNTER — Encounter: Payer: Self-pay | Admitting: Urology

## 2018-08-10 VITALS — BP 100/61 | HR 63 | Ht 62.0 in | Wt 184.0 lb

## 2018-08-10 DIAGNOSIS — N471 Phimosis: Secondary | ICD-10-CM

## 2018-08-15 NOTE — Progress Notes (Signed)
08/10/2018 6:41 PM   Quillian Quince [facility] 04-Mar-1936 [id number]  Referring provider: [facility] [address] [place], [place] [address]  No chief complaint on file.   HPI: Patient is an 82 year old Caucasian male with MR and a history of phimosis who underwent dorsal slit and penile biopsy on 07/20/2018.  Background history Patient is an 82 year old Caucasian male with MR who presents with his sister, [name], for a recheck.  History is obtained from sister.  He was last seen in our office in 2017 for the complaint of spots of blood found in his underpants.  He was not found to have blood on microscopic analysis of his urine and his sister and caregivers were to report back to the office if they should see any gross hematuria with Mr. Cantera.  His sister states that she has not noted any further blood in the urine, but her concern today is he is constantly in the restroom urinating and taking a long time to urinate.  She has noticed this as well as the staff at the [facility] [address]  She carried him to his primary care and had a urine checked and was told there was no infection.  His PVR today is  71 mL.    Penile biopsy was negative for malignancy.    He was seen in the ED on 07/23/2018 for concerns of post operative problems.  Patient was examined and his exam was consistent with normal post-operative changes.  He was given Nystatin powder and placed on Keflex.  He had been placed on Septra DS after his procedure.  He is currently on Septra DS only per MAR and Bacitracin.    Today, his sister feels that he is doing much better.  She is concerned that the head of the penis is not been cleaned sufficiently as she states that there is still thick white discharge seen on the head of the glans.  He is having frequency, urgency, nocturia and incontinence, but these are baseline.  Patient denies any gross hematuria, dysuria or suprapubic/flank pain.  Patient  denies any fevers, chills, nausea or vomiting.   PMH: Past Medical History:  Diagnosis Date  . Acid reflux   . Anxiety   . Asthma   . Atrial fibrillation [facility])    family unaware of this diagnosis  . BPH (benign prostatic hyperplasia)   . CHF (congestive heart failure) (HCC)   . Dementia   . Diabetes mellitus without complication (HCC)    diet controlled  . Heart disease   . HLD (hyperlipidemia)   . HTN (hypertension)   . Kidney disease   . Mental disability   . MI (myocardial infarction) [facility])    2012    Surgical History: Past Surgical History:  Procedure Laterality Date  . CIRCUMCISION N/A 07/20/2018   Procedure: CIRCUMCISION ADULT;  Surgeon: Riki Altes, MD;  Location: [facility] ORS;  Service: Urology;  Laterality: N/A;  . HERNIA REPAIR  1990    Home Medications:  Allergies as of 08/10/2018   No Known Allergies     Medication List        Accurate as of 08/10/18 11:59 PM. Always use your most recent med list.          acetaminophen 325 MG tablet Commonly known as:  TYLENOL Take 650 mg by mouth every 4 (four) hours as needed for mild pain or fever.   ADVAIR HFA 45-21 MCG/ACT inhaler Generic drug:  fluticasone-salmeterol Inhale  2 puffs into the lungs 2 (two) times daily.   albuterol 108 (90 Base) MCG/ACT inhaler Commonly known as:  PROVENTIL HFA;VENTOLIN HFA Inhale 2 puffs into the lungs every 4 (four) hours as needed for wheezing or shortness of breath.   aspirin 81 MG chewable tablet Chew 81 mg by mouth daily.   barrier cream Crea Commonly known as:  non-specified Apply 1 application topically See admin instructions. Apply to buttocks twice daily, may apply after toileting as needed to prevent skin breakdown   benzonatate 100 MG capsule Commonly known as:  TESSALON Please dose 1 capsule every 6 hours for the first 48 hours and then use every 6 hours as needed for cough after that.   cephALEXin 500 MG capsule Commonly known as:  KEFLEX Take 1 capsule  (500 mg total) by mouth 2 (two) times daily.   citalopram 10 MG tablet Commonly known as:  CELEXA Take 10 mg by mouth daily.   CRANBERRY JUICE EXTRACT PO Take 8 oz by mouth daily.   diphenhydrAMINE 25 MG tablet Commonly known as:  BENADRYL Take 25 mg by mouth every 6 (six) hours as needed for allergies.   finasteride 5 MG tablet Commonly known as:  PROSCAR Take 5 mg by mouth daily.   fluticasone 50 MCG/ACT nasal spray Commonly known as:  FLONASE Place 2 sprays into both nostrils daily.   furosemide 40 MG tablet Commonly known as:  LASIX Take 40 mg by mouth 2 (two) times daily.   GOLD BOND MEDICATED ANTI ITCH EX Apply 1 application topically 2 (two) times daily. To legs   guaiFENesin 100 MG/5ML liquid Commonly known as:  ROBITUSSIN Take 300 mg by mouth every 6 (six) hours as needed for cough.   HYDROCERIN EX Apply 1 application topically 2 (two) times daily. To face   BAZA PROTECT EX Apply 1 application topically 3 (three) times daily.   hydrocortisone 2.5 % cream Apply 1 application topically 2 (two) times daily. (to facial rash until healed)   INCRUSE ELLIPTA 62.5 MCG/INH Aepb Generic drug:  umeclidinium bromide Inhale 1 puff into the lungs daily.   isosorbide mononitrate 60 MG 24 hr tablet Commonly known as:  IMDUR Take 60 mg by mouth 2 (two) times daily.   loperamide 2 MG tablet Commonly known as:  IMODIUM A-D Take 2-4 mg by mouth as needed for diarrhea or loose stools. (max 8 doses per 24 hours)   loratadine 10 MG tablet Commonly known as:  CLARITIN Take 10 mg by mouth daily.   losartan 50 MG tablet Commonly known as:  COZAAR Take 50 mg by mouth daily.   magnesium hydroxide 400 MG/5ML suspension Commonly known as:  MILK OF MAGNESIA Take 30 mLs by mouth daily as needed for mild constipation.   menthol-zinc oxide powder Apply 1 application topically daily as needed (dry itchy skin).   mineral oil external liquid Apply 1 application topically  daily. Apply to flaky spots on forehead and ears   MINTOX 200-200-20 MG/5ML suspension Generic drug:  alum & mag hydroxide-simeth Take 30 mLs by mouth 4 (four) times daily as needed for indigestion or heartburn.   mirtazapine 30 MG tablet Commonly known as:  REMERON Take 30 mg by mouth at bedtime.   nitroGLYCERIN 0.4 MG SL tablet Commonly known as:  NITROSTAT Place 0.4 mg under the tongue every 5 (five) minutes as needed for chest pain.   NUTRITIONAL DRINK Liqd Take 1 Dose by mouth 3 (three) times daily with meals. [employer]  nystatin powder Commonly known as:  MYCOSTATIN/NYSTOP Apply topically 4 (four) times daily.   pantoprazole 40 MG tablet Commonly known as:  PROTONIX Take 40 mg by mouth 2 (two) times daily.   polyethylene glycol packet Commonly known as:  MIRALAX / GLYCOLAX Take 17 g by mouth daily.   pyrithione zinc 1 % shampoo Commonly known as:  HEAD AND SHOULDERS Apply 1 application topically daily as needed (dandruff).   ranolazine 500 MG 12 hr tablet Commonly known as:  RANEXA Take 500 mg by mouth 2 (two) times daily.   ROBITUSSIN DM PO Take 5 mLs by mouth every 6 (six) hours as needed (cough).   tamsulosin 0.4 MG Caps capsule Commonly known as:  FLOMAX Take 1 capsule (0.4 mg total) by mouth daily.   traMADol 50 MG tablet Commonly known as:  ULTRAM Take 1 tablet (50 mg total) by mouth 3 (three) times daily. Give 3 times daily x48 hours then as needed.  Hold for increased somnolence   triamcinolone cream 0.1 % Commonly known as:  KENALOG Apply 1 application topically 2 (two) times daily as needed ((itching or rash on scrotum)).       Allergies: No Known Allergies  Family History: History reviewed. No pertinent family history.  Social History:  reports that he has quit smoking. He has never used smokeless tobacco. He reports that he does not drink alcohol or use drugs.  ROS: UROLOGY Frequent Urination?: Yes Hard to postpone urination?:  Yes Burning/pain with urination?: No Get up at night to urinate?: Yes Leakage of urine?: Yes Urine stream starts and stops?: No Trouble starting stream?: No Do you have to strain to urinate?: No Blood in urine?: No Urinary tract infection?: No Sexually transmitted disease?: No Injury to kidneys or bladder?: No Painful intercourse?: No Weak stream?: No Erection problems?: No Penile pain?: No  Gastrointestinal Nausea?: Yes Vomiting?: Yes Indigestion/heartburn?: No Diarrhea?: No Constipation?: No  Constitutional Fever: No Night sweats?: No Weight loss?: No Fatigue?: No  Skin Skin rash/lesions?: No Itching?: No  Eyes Blurred vision?: No Double vision?: No  Ears/Nose/Throat Sore throat?: No Sinus problems?: No  Hematologic/Lymphatic Swollen glands?: No Easy bruising?: No  Cardiovascular Leg swelling?: Yes Chest pain?: No  Respiratory Cough?: No Shortness of breath?: Yes  Endocrine Excessive thirst?: No  Musculoskeletal Back pain?: No Joint pain?: No  Neurological Headaches?: No Dizziness?: No  Psychologic Depression?: Yes Anxiety?: Yes  Physical Exam: BP 100/61 (BP Location: Left Arm, Patient Position: Sitting, Cuff Size: Normal)   Pulse 63   Ht 5\' 2"  (1.575 m)   Wt 184 lb (83.5 kg)   BMI 33.65 kg/m   Constitutional: Well nourished. Alert and oriented, No acute distress. HEENT: [place] AT, moist mucus membranes. Trachea midline, no masses. Cardiovascular: No clubbing, cyanosis, or edema. Respiratory: Normal respiratory effort, no increased work of breathing. GI: Abdomen is soft, non tender, non distended, no abdominal masses. Liver and spleen not palpable.  No hernias appreciated.  Stool sample for occult testing is not indicated.   GU: No CVA tenderness.  No bladder fullness or masses.  Penis with dorsal slit.  Incisions clean and dry.  The foreskin is shifted around the head of the penis and smegma is noted to be collecting on the glands.    Skin: No rashes, bruises or suspicious lesions. Lymph: No cervical or inguinal adenopathy. Neurologic: Grossly intact, no focal deficits, moving all 4 extremities. Psychiatric: Normal mood and affect.  Laboratory Data: Lab Results  Component Value Date  WBC 4.9 07/23/2018   HGB 11.8 (L) 07/23/2018   HCT 34.2 (L) 07/23/2018   MCV 90.2 07/23/2018   PLT 166 07/23/2018    Lab Results  Component Value Date   CREATININE 1.59 (H) 07/23/2018    Lab Results  Component Value Date   AST 51 (H) 01/22/2018   Lab Results  Component Value Date   ALT 41 01/22/2018   PSA history  1.5 ng/mL on 07/03/2015  Results for orders placed or performed during the hospital encounter of 07/23/18  CBC with Differential  Result Value Ref Range   WBC 4.9 3.8 - 10.6 K/uL   RBC 3.78 (L) 4.40 - 5.90 MIL/uL   Hemoglobin 11.8 (L) 13.0 - 18.0 g/dL   HCT 64.4 (L) 03.4 - 74.2 %   MCV 90.2 80.0 - 100.0 fL   MCH 31.2 26.0 - 34.0 pg   MCHC 34.6 32.0 - 36.0 g/dL   RDW 59.5 (H) 63.8 - 75.6 %   Platelets 166 150 - 440 K/uL   Neutrophils Relative % 61 %   Neutro Abs 3.0 1.4 - 6.5 K/uL   Lymphocytes Relative 24 %   Lymphs Abs 1.2 1.0 - 3.6 K/uL   Monocytes Relative 9 %   Monocytes Absolute 0.5 0.2 - 1.0 K/uL   Eosinophils Relative 5 %   Eosinophils Absolute 0.2 0 - 0.7 K/uL   Basophils Relative 1 %   Basophils Absolute 0.0 0 - 0.1 K/uL  Basic metabolic panel  Result Value Ref Range   Sodium 133 (L) 135 - 145 mmol/L   Potassium 3.9 3.5 - 5.1 mmol/L   Chloride 99 98 - 111 mmol/L   CO2 26 22 - 32 mmol/L   Glucose, Bld 191 (H) 70 - 99 mg/dL   BUN 34 (H) 8 - 23 mg/dL   Creatinine, Ser 4.33 (H) 0.61 - 1.24 mg/dL   Calcium 8.6 (L) 8.9 - 10.3 mg/dL   GFR calc non Af Amer 39 (L) >60 mL/min   GFR calc Af Amer 46 (L) >60 mL/min   Anion gap 8 5 - 15   I have reviewed the labs.  Assessment & Plan:    1. Phimosis Progressing as expected Demonstrated to his sister how to move the foreskin around to  explore more area of the glans for cleansing Also given written instructions so caregivers can do the same RTC in 2 weeks for recheck    Return in about 2 weeks (around 08/24/2018) for recheck.  These notes generated with voice recognition software. I apologize for typographical errors.  Michiel Cowboy, PA-C  [facility] [address] [place], [place] [address] [phone]

## 2018-08-24 ENCOUNTER — Encounter: Payer: Self-pay | Admitting: Urology

## 2018-08-24 ENCOUNTER — Ambulatory Visit (INDEPENDENT_AMBULATORY_CARE_PROVIDER_SITE_OTHER): Payer: Medicare Other | Admitting: Urology

## 2018-08-24 VITALS — BP 127/66 | HR 58 | Resp 16 | Ht 62.0 in | Wt 186.0 lb

## 2018-08-24 DIAGNOSIS — N471 Phimosis: Secondary | ICD-10-CM

## 2018-08-24 NOTE — Progress Notes (Signed)
08/24/2018 12:02 PM   Quillian Quince [facility] 28-May-1936 [id number]  Referring provider: [facility] [address] [place], [place] [address]  Chief Complaint  Patient presents with  . Follow-up    HPI: Patient is an 82 year old Caucasian male with MR and a history of phimosis who underwent dorsal slit and penile biopsy on 07/20/2018.  Background history Patient is an 82 year old Caucasian male with MR who presents with his sister, [name], for a recheck.  History is obtained from sister.  He was last seen in our office in 2017 for the complaint of spots of blood found in his underpants.  He was not found to have blood on microscopic analysis of his urine and his sister and caregivers were to report back to the office if they should see any gross hematuria with Mr. Rowe.  His sister states that she has not noted any further blood in the urine, but her concern today is he is constantly in the restroom urinating and taking a long time to urinate.  She has noticed this as well as the staff at the [facility] [address]  She carried him to his primary care and had a urine checked and was told there was no infection.  His PVR today is  71 mL.    Penile biopsy was negative for malignancy.    He was seen in the ED on 07/23/2018 for concerns of post operative problems.  Patient was examined and his exam was consistent with normal post-operative changes.  He was given Nystatin powder and placed on Keflex.  He had been placed on Septra DS after his procedure.  He is currently on Septra DS only per MAR and Bacitracin.    Today, his sister is pleased with his progress after the dorsal slit procedure.  She states that the area is remaining clean and that he is not complaining of discomfort.  She states that he is having frequency, urgency and nocturia.  Patient denies any gross hematuria, dysuria or suprapubic/flank pain.  Patient denies any fevers, chills, nausea or  vomiting.   PMH: Past Medical History:  Diagnosis Date  . Acid reflux   . Anxiety   . Asthma   . Atrial fibrillation [facility])    family unaware of this diagnosis  . BPH (benign prostatic hyperplasia)   . CHF (congestive heart failure) (HCC)   . Dementia (HCC)   . Diabetes mellitus without complication (HCC)    diet controlled  . Heart disease   . HLD (hyperlipidemia)   . HTN (hypertension)   . Kidney disease   . Mental disability   . MI (myocardial infarction) [facility])    2012    Surgical History: Past Surgical History:  Procedure Laterality Date  . CIRCUMCISION N/A 07/20/2018   Procedure: CIRCUMCISION ADULT;  Surgeon: Riki Altes, MD;  Location: [facility] ORS;  Service: Urology;  Laterality: N/A;  . HERNIA REPAIR  1990    Home Medications:  Allergies as of 08/24/2018   No Known Allergies     Medication List        Accurate as of 08/24/18 12:02 PM. Always use your most recent med list.          acetaminophen 325 MG tablet Commonly known as:  TYLENOL Take 650 mg by mouth every 4 (four) hours as needed for mild pain or fever.   ADVAIR HFA 45-21 MCG/ACT inhaler Generic drug:  fluticasone-salmeterol Inhale 2 puffs into the lungs 2 (two) times  daily.   albuterol 108 (90 Base) MCG/ACT inhaler Commonly known as:  PROVENTIL HFA;VENTOLIN HFA Inhale 2 puffs into the lungs every 4 (four) hours as needed for wheezing or shortness of breath.   aspirin 81 MG chewable tablet Chew 81 mg by mouth daily.   barrier cream Crea Commonly known as:  non-specified Apply 1 application topically See admin instructions. Apply to buttocks twice daily, may apply after toileting as needed to prevent skin breakdown   benzonatate 100 MG capsule Commonly known as:  TESSALON Please dose 1 capsule every 6 hours for the first 48 hours and then use every 6 hours as needed for cough after that.   cephALEXin 500 MG capsule Commonly known as:  KEFLEX Take 1 capsule (500 mg total) by mouth 2  (two) times daily.   citalopram 10 MG tablet Commonly known as:  CELEXA Take 10 mg by mouth daily.   CRANBERRY JUICE EXTRACT PO Take 8 oz by mouth daily.   diphenhydrAMINE 25 MG tablet Commonly known as:  BENADRYL Take 25 mg by mouth every 6 (six) hours as needed for allergies.   finasteride 5 MG tablet Commonly known as:  PROSCAR Take 5 mg by mouth daily.   fluticasone 50 MCG/ACT nasal spray Commonly known as:  FLONASE Place 2 sprays into both nostrils daily.   furosemide 40 MG tablet Commonly known as:  LASIX Take 40 mg by mouth 2 (two) times daily.   GOLD BOND MEDICATED ANTI ITCH EX Apply 1 application topically 2 (two) times daily. To legs   guaiFENesin 100 MG/5ML liquid Commonly known as:  ROBITUSSIN Take 300 mg by mouth every 6 (six) hours as needed for cough.   HYDROCERIN EX Apply 1 application topically 2 (two) times daily. To face   BAZA PROTECT EX Apply 1 application topically 3 (three) times daily.   hydrocortisone 2.5 % cream Apply 1 application topically 2 (two) times daily. (to facial rash until healed)   INCRUSE ELLIPTA 62.5 MCG/INH Aepb Generic drug:  umeclidinium bromide Inhale 1 puff into the lungs daily.   isosorbide mononitrate 60 MG 24 hr tablet Commonly known as:  IMDUR Take 60 mg by mouth 2 (two) times daily.   loperamide 2 MG tablet Commonly known as:  IMODIUM A-D Take 2-4 mg by mouth as needed for diarrhea or loose stools. (max 8 doses per 24 hours)   loratadine 10 MG tablet Commonly known as:  CLARITIN Take 10 mg by mouth daily.   losartan 50 MG tablet Commonly known as:  COZAAR Take 50 mg by mouth daily.   magnesium hydroxide 400 MG/5ML suspension Commonly known as:  MILK OF MAGNESIA Take 30 mLs by mouth daily as needed for mild constipation.   menthol-zinc oxide powder Apply 1 application topically daily as needed (dry itchy skin).   mineral oil external liquid Apply 1 application topically daily. Apply to flaky spots on  forehead and ears   MINTOX 200-200-20 MG/5ML suspension Generic drug:  alum & mag hydroxide-simeth Take 30 mLs by mouth 4 (four) times daily as needed for indigestion or heartburn.   mirtazapine 30 MG tablet Commonly known as:  REMERON Take 30 mg by mouth at bedtime.   nitroGLYCERIN 0.4 MG SL tablet Commonly known as:  NITROSTAT Place 0.4 mg under the tongue every 5 (five) minutes as needed for chest pain.   NUTRITIONAL DRINK Liqd Take 1 Dose by mouth 3 (three) times daily with meals. [employer]   nystatin powder Commonly known as:  MYCOSTATIN/NYSTOP Apply topically 4 (four) times daily.   pantoprazole 40 MG tablet Commonly known as:  PROTONIX Take 40 mg by mouth 2 (two) times daily.   polyethylene glycol packet Commonly known as:  MIRALAX / GLYCOLAX Take 17 g by mouth daily.   pyrithione zinc 1 % shampoo Commonly known as:  HEAD AND SHOULDERS Apply 1 application topically daily as needed (dandruff).   ranolazine 500 MG 12 hr tablet Commonly known as:  RANEXA Take 500 mg by mouth 2 (two) times daily.   ROBITUSSIN DM PO Take 5 mLs by mouth every 6 (six) hours as needed (cough).   tamsulosin 0.4 MG Caps capsule Commonly known as:  FLOMAX Take 1 capsule (0.4 mg total) by mouth daily.   traMADol 50 MG tablet Commonly known as:  ULTRAM Take 1 tablet (50 mg total) by mouth 3 (three) times daily. Give 3 times daily x48 hours then as needed.  Hold for increased somnolence   triamcinolone cream 0.1 % Commonly known as:  KENALOG Apply 1 application topically 2 (two) times daily as needed ((itching or rash on scrotum)).       Allergies: No Known Allergies  Family History: No family history on file.  Social History:  reports that he has quit smoking. He has never used smokeless tobacco. He reports that he does not drink alcohol or use drugs.  ROS: UROLOGY Frequent Urination?: Yes Hard to postpone urination?: Yes Burning/pain with urination?: No Get up at night  to urinate?: Yes Leakage of urine?: No Urine stream starts and stops?: No Trouble starting stream?: No Do you have to strain to urinate?: No Blood in urine?: No Urinary tract infection?: No Injury to kidneys or bladder?: No Painful intercourse?: No Weak stream?: No Erection problems?: No Penile pain?: No  Gastrointestinal Nausea?: No Vomiting?: No Indigestion/heartburn?: Yes Diarrhea?: No Constipation?: No  Constitutional Fever: No Weight loss?: No Fatigue?: No  Skin Skin rash/lesions?: No Itching?: No  Eyes Blurred vision?: No Double vision?: No  Ears/Nose/Throat Sore throat?: No Sinus problems?: No  Hematologic/Lymphatic Swollen glands?: No Easy bruising?: No  Cardiovascular Leg swelling?: Yes Chest pain?: No  Respiratory Cough?: Yes Shortness of breath?: No  Endocrine Excessive thirst?: No  Musculoskeletal Back pain?: No Joint pain?: No  Neurological Headaches?: No Dizziness?: No  Psychologic Depression?: No Anxiety?: No  Physical Exam: BP 127/66   Pulse (!) 58   Resp 16   Ht 5\' 2"  (1.575 m)   Wt 186 lb (84.4 kg)   BMI 34.02 kg/m   Constitutional: Well nourished. Alert and oriented, No acute distress. HEENT: [place] AT, moist mucus membranes. Trachea midline, no masses. Cardiovascular: No clubbing, cyanosis, or edema. Respiratory: Normal respiratory effort, no increased work of breathing. GI: Abdomen is soft, non tender, non distended, no abdominal masses. Liver and spleen not palpable.  No hernias appreciated.  Stool sample for occult testing is not indicated.   GU: No CVA tenderness.  No bladder fullness or masses.  Penis with a dorsal slit.  Incision is clean and dry.  The glans of the penis is without smegma.   Skin: No rashes, bruises or suspicious lesions. Lymph: No cervical or inguinal adenopathy. Neurologic: Grossly intact, no focal deficits, moving all 4 extremities. Psychiatric: Normal mood and affect.   Laboratory  Data: Lab Results  Component Value Date   WBC 4.9 07/23/2018   HGB 11.8 (L) 07/23/2018   HCT 34.2 (L) 07/23/2018   MCV 90.2 07/23/2018   PLT 166 07/23/2018  Lab Results  Component Value Date   CREATININE 1.59 (H) 07/23/2018    Lab Results  Component Value Date   AST 51 (H) 01/22/2018   Lab Results  Component Value Date   ALT 41 01/22/2018   PSA history  1.5 ng/mL on 07/03/2015  Results for orders placed or performed during the hospital encounter of 07/23/18  CBC with Differential  Result Value Ref Range   WBC 4.9 3.8 - 10.6 K/uL   RBC 3.78 (L) 4.40 - 5.90 MIL/uL   Hemoglobin 11.8 (L) 13.0 - 18.0 g/dL   HCT 16.1 (L) 09.6 - 04.5 %   MCV 90.2 80.0 - 100.0 fL   MCH 31.2 26.0 - 34.0 pg   MCHC 34.6 32.0 - 36.0 g/dL   RDW 40.9 (H) 81.1 - 91.4 %   Platelets 166 150 - 440 K/uL   Neutrophils Relative % 61 %   Neutro Abs 3.0 1.4 - 6.5 K/uL   Lymphocytes Relative 24 %   Lymphs Abs 1.2 1.0 - 3.6 K/uL   Monocytes Relative 9 %   Monocytes Absolute 0.5 0.2 - 1.0 K/uL   Eosinophils Relative 5 %   Eosinophils Absolute 0.2 0 - 0.7 K/uL   Basophils Relative 1 %   Basophils Absolute 0.0 0 - 0.1 K/uL  Basic metabolic panel  Result Value Ref Range   Sodium 133 (L) 135 - 145 mmol/L   Potassium 3.9 3.5 - 5.1 mmol/L   Chloride 99 98 - 111 mmol/L   CO2 26 22 - 32 mmol/L   Glucose, Bld 191 (H) 70 - 99 mg/dL   BUN 34 (H) 8 - 23 mg/dL   Creatinine, Ser 7.82 (H) 0.61 - 1.24 mg/dL   Calcium 8.6 (L) 8.9 - 10.3 mg/dL   GFR calc non Af Amer 39 (L) >60 mL/min   GFR calc Af Amer 46 (L) >60 mL/min   Anion gap 8 5 - 15   I have reviewed the labs.  Assessment & Plan:    1. Phimosis Well healed Will return in 3 months to review urinary symptoms and check PVR   Return in about 3 months (around 11/24/2018) for pvr.  These notes generated with voice recognition software. I apologize for typographical errors.  Michiel Cowboy, PA-C  [facility] [address] [place], [place] [address] [phone]

## 2018-11-30 ENCOUNTER — Ambulatory Visit: Payer: Medicare Other | Admitting: Urology

## 2018-12-28 ENCOUNTER — Ambulatory Visit: Payer: Medicare Other | Admitting: Urology

## 2018-12-30 ENCOUNTER — Ambulatory Visit: Payer: Medicare Other | Admitting: Urology

## 2019-01-02 NOTE — Progress Notes (Signed)
01/03/2019 4:04 PM   [facility] 09/14/1936 [id number]  Referring provider: [facility] [address] [place], [place] [address]  Chief Complaint  Patient presents with  . Follow-up    PVR    HPI: Patient is an 83 year old Caucasian male with MR and a history of phimosis who underwent dorsal slit and penile biopsy on 07/20/2018 who presents with his sister, Ralph Boyd.    Dorsal slit and penile biopsy on 07/20/2018.  Penile biopsy was negative for malignancy.    He was seen in the ED on 07/23/2018 for concerns of post operative problems.  Patient was examined and his exam was consistent with normal post-operative changes.  He was given Nystatin powder and placed on Keflex.  He had been placed on Septra DS after his procedure.    His PVR today is 95 mL.  He has no complaints today.  Ralph Boyd states that his SNIF has not complained of him having any issues.  She has noted a decrease in urinary frequency.  Patient denies any gross hematuria, dysuria or suprapubic/flank pain.  Patient denies any fevers, chills, nausea or vomiting.    PMH: Past Medical History:  Diagnosis Date  . Acid reflux   . Anxiety   . Asthma   . Atrial fibrillation [facility])    family unaware of this diagnosis  . BPH (benign prostatic hyperplasia)   . CHF (congestive heart failure) (HCC)   . Dementia (HCC)   . Diabetes mellitus without complication (HCC)    diet controlled  . Heart disease   . HLD (hyperlipidemia)   . HTN (hypertension)   . Kidney disease   . Mental disability   . MI (myocardial infarction) [facility])    2012    Surgical History: Past Surgical History:  Procedure Laterality Date  . CIRCUMCISION N/A 07/20/2018   Procedure: CIRCUMCISION ADULT;  Surgeon: Riki Altes, MD;  Location: [facility] ORS;  Service: Urology;  Laterality: N/A;  . HERNIA REPAIR  1990    Home Medications:  Allergies as of 01/03/2019   No Known Allergies     Medication List       Accurate as of January 03, 2019  4:04 PM. Always use your most recent med list.        acetaminophen 325 MG tablet Commonly known as:  TYLENOL Take 650 mg by mouth every 4 (four) hours as needed for mild pain or fever.   ADVAIR HFA 45-21 MCG/ACT inhaler Generic drug:  fluticasone-salmeterol Inhale 2 puffs into the lungs 2 (two) times daily.   albuterol 108 (90 Base) MCG/ACT inhaler Commonly known as:  PROVENTIL HFA;VENTOLIN HFA Inhale 2 puffs into the lungs every 4 (four) hours as needed for wheezing or shortness of breath.   aspirin 81 MG chewable tablet Chew 81 mg by mouth daily.   atorvastatin 20 MG tablet Commonly known as:  LIPITOR   barrier cream Crea Commonly known as:  non-specified Apply 1 application topically See admin instructions. Apply to buttocks twice daily, may apply after toileting as needed to prevent skin breakdown   benzonatate 100 MG capsule Commonly known as:  TESSALON PERLES Please dose 1 capsule every 6 hours for the first 48 hours and then use every 6 hours as needed for cough after that.   cephALEXin 500 MG capsule Commonly known as:  KEFLEX Take 1 capsule (500 mg total) by mouth 2 (two) times daily.   citalopram 10 MG tablet Commonly known as:  CELEXA  Take 10 mg by mouth daily.   CRANBERRY JUICE EXTRACT PO Take 8 oz by mouth daily.   diphenhydrAMINE 25 MG tablet Commonly known as:  BENADRYL Take 25 mg by mouth every 6 (six) hours as needed for allergies.   finasteride 5 MG tablet Commonly known as:  PROSCAR Take 5 mg by mouth daily.   fluticasone 50 MCG/ACT nasal spray Commonly known as:  FLONASE Place 2 sprays into both nostrils daily.   furosemide 40 MG tablet Commonly known as:  LASIX Take 40 mg by mouth 2 (two) times daily.   GOLD BOND MEDICATED ANTI ITCH EX Apply 1 application topically 2 (two) times daily. To legs   guaiFENesin 100 MG/5ML liquid Commonly known as:  ROBITUSSIN Take 300 mg by mouth every 6 (six) hours  as needed for cough.   HYDROCERIN EX Apply 1 application topically 2 (two) times daily. To face   BAZA PROTECT EX Apply 1 application topically 3 (three) times daily.   hydrocortisone 2.5 % cream Apply 1 application topically 2 (two) times daily. (to facial rash until healed)   INCRUSE ELLIPTA 62.5 MCG/INH Aepb Generic drug:  umeclidinium bromide Inhale 1 puff into the lungs daily.   isosorbide mononitrate 60 MG 24 hr tablet Commonly known as:  IMDUR Take 60 mg by mouth 2 (two) times daily.   loperamide 2 MG tablet Commonly known as:  IMODIUM A-D Take 2-4 mg by mouth as needed for diarrhea or loose stools. (max 8 doses per 24 hours)   loratadine 10 MG tablet Commonly known as:  CLARITIN Take 10 mg by mouth daily.   losartan 50 MG tablet Commonly known as:  COZAAR Take 50 mg by mouth daily.   magnesium hydroxide 400 MG/5ML suspension Commonly known as:  MILK OF MAGNESIA Take 30 mLs by mouth daily as needed for mild constipation.   menthol-zinc oxide powder Apply 1 application topically daily as needed (dry itchy skin).   mineral oil external liquid Apply 1 application topically daily. Apply to flaky spots on forehead and ears   MINTOX 200-200-20 MG/5ML suspension Generic drug:  alum & mag hydroxide-simeth Take 30 mLs by mouth 4 (four) times daily as needed for indigestion or heartburn.   mirtazapine 30 MG tablet Commonly known as:  REMERON Take 30 mg by mouth at bedtime.   nitroGLYCERIN 0.4 MG SL tablet Commonly known as:  NITROSTAT Place 0.4 mg under the tongue every 5 (five) minutes as needed for chest pain.   NUTRITIONAL DRINK Liqd Take 1 Dose by mouth 3 (three) times daily with meals. House Shakes   nystatin powder Commonly known as:  MYCOSTATIN/NYSTOP Apply topically 4 (four) times daily.   pantoprazole 40 MG tablet Commonly known as:  PROTONIX Take 40 mg by mouth 2 (two) times daily.   polyethylene glycol packet Commonly known as:  MIRALAX /  GLYCOLAX Take 17 g by mouth daily.   pyrithione zinc 1 % shampoo Commonly known as:  HEAD AND SHOULDERS Apply 1 application topically daily as needed (dandruff).   ranolazine 500 MG 12 hr tablet Commonly known as:  RANEXA Take 500 mg by mouth 2 (two) times daily.   ROBITUSSIN DM PO Take 5 mLs by mouth every 6 (six) hours as needed (cough).   traMADol 50 MG tablet Commonly known as:  ULTRAM Take 1 tablet (50 mg total) by mouth 3 (three) times daily. Give 3 times daily x48 hours then as needed.  Hold for increased somnolence   triamcinolone cream 0.1 %  Commonly known as:  KENALOG Apply 1 application topically 2 (two) times daily as needed ((itching or rash on scrotum)).       Allergies: No Known Allergies  Family History: History reviewed. No pertinent family history.  Social History:  reports that he has quit smoking. He has never used smokeless tobacco. He reports that he does not drink alcohol or use drugs.  ROS: UROLOGY Frequent Urination?: No Hard to postpone urination?: No Burning/pain with urination?: No Get up at night to urinate?: No Leakage of urine?: No Urine stream starts and stops?: No Trouble starting stream?: No Do you have to strain to urinate?: No Blood in urine?: No Urinary tract infection?: No Sexually transmitted disease?: No Injury to kidneys or bladder?: No Painful intercourse?: No Weak stream?: No Erection problems?: No Penile pain?: No  Gastrointestinal Nausea?: No Vomiting?: No Indigestion/heartburn?: No Diarrhea?: No Constipation?: No  Constitutional Fever: No Night sweats?: No Weight loss?: No Fatigue?: No  Skin Skin rash/lesions?: No Itching?: No  Eyes Blurred vision?: No Double vision?: No  Ears/Nose/Throat Sore throat?: No Sinus problems?: No  Hematologic/Lymphatic Swollen glands?: No Easy bruising?: No  Cardiovascular Leg swelling?: Yes Chest pain?: No  Respiratory Cough?: No Shortness of breath?:  Yes  Endocrine Excessive thirst?: No  Musculoskeletal Back pain?: No Joint pain?: No  Neurological Headaches?: No Dizziness?: No  Psychologic Depression?: Yes Anxiety?: Yes  Physical Exam: BP 125/67   Pulse 74   Temp (!) 96.8 F (36 C) (Other (Comment))   Resp 18   Ht 5\' 7"  (1.702 m)   Wt 193 lb (87.5 kg)   BMI 30.23 kg/m   Constitutional:  Well nourished. Alert and oriented, No acute distress. HEENT: [place] AT, moist mucus membranes.  Trachea midline, no masses. Cardiovascular: No clubbing, cyanosis, or edema. Respiratory: Normal respiratory effort, no increased work of breathing. GI: Abdomen is soft, non tender, non distended, no abdominal masses. Liver and spleen not palpable.  No hernias appreciated.  Stool sample for occult testing is not indicated.   GU: No CVA tenderness.  No bladder fullness or masses.  Patient uncircumcised phallus.  Foreskin not able to be retracted  Urethral meatus is patent.  No penile discharge. No penile lesions or rashes. Scrotum without lesions, cysts, rashes and/or edema.  Testicles are located scrotally bilaterally. No masses are appreciated in the testicles. Left and right epididymis are normal. Rectal: Not performed. Skin: No rashes, bruises or suspicious lesions. Lymph: No inguinal adenopathy. Neurologic: Grossly intact, no focal deficits, moving all 4 extremities. Psychiatric: Normal mood and affect.  Laboratory Data: Lab Results  Component Value Date   WBC 4.9 07/23/2018   HGB 11.8 (L) 07/23/2018   HCT 34.2 (L) 07/23/2018   MCV 90.2 07/23/2018   PLT 166 07/23/2018    Lab Results  Component Value Date   CREATININE 1.59 (H) 07/23/2018    Lab Results  Component Value Date   AST 51 (H) 01/22/2018   Lab Results  Component Value Date   ALT 41 01/22/2018   PSA history  1.5 ng/mL on 07/03/2015  Results for orders placed or performed in visit on 01/03/19  [place], retroperitnl abd,  ltd  Result Value Ref Range   Scan Result  22ml    I have reviewed the labs.  Assessment & Plan:    1. Phimosis Well healed  2. LU TS Resolved  Return in about 1 year (around 01/04/2020) for PVR .  These notes generated with voice recognition software. I apologize for  typographical errors.  Michiel CowboySHANNON Monti Villers, PA-C  [facility] [address] [place], [place] [address] [phone]

## 2019-01-03 ENCOUNTER — Encounter: Payer: Self-pay | Admitting: Urology

## 2019-01-03 ENCOUNTER — Other Ambulatory Visit: Payer: Self-pay

## 2019-01-03 ENCOUNTER — Ambulatory Visit (INDEPENDENT_AMBULATORY_CARE_PROVIDER_SITE_OTHER): Payer: Medicare Other | Admitting: Urology

## 2019-01-03 VITALS — BP 125/67 | HR 74 | Temp 96.8°F | Resp 18 | Ht 67.0 in | Wt 193.0 lb

## 2019-01-03 DIAGNOSIS — N471 Phimosis: Secondary | ICD-10-CM

## 2019-01-03 LAB — US, RETROPERITNL ABD,  LTD

## 2019-02-23 DEATH — deceased

## 2019-04-03 IMAGING — DX DG ABDOMEN 2V
2 series · 2 of 2 positions shown · non-contrast
Comparison: CT abdomen pelvis 01/13/2014

CLINICAL DATA: Abdominal distension with possible gastroenteritis.

EXAM:
ABDOMEN - 2 VIEW

[abdomen erect]
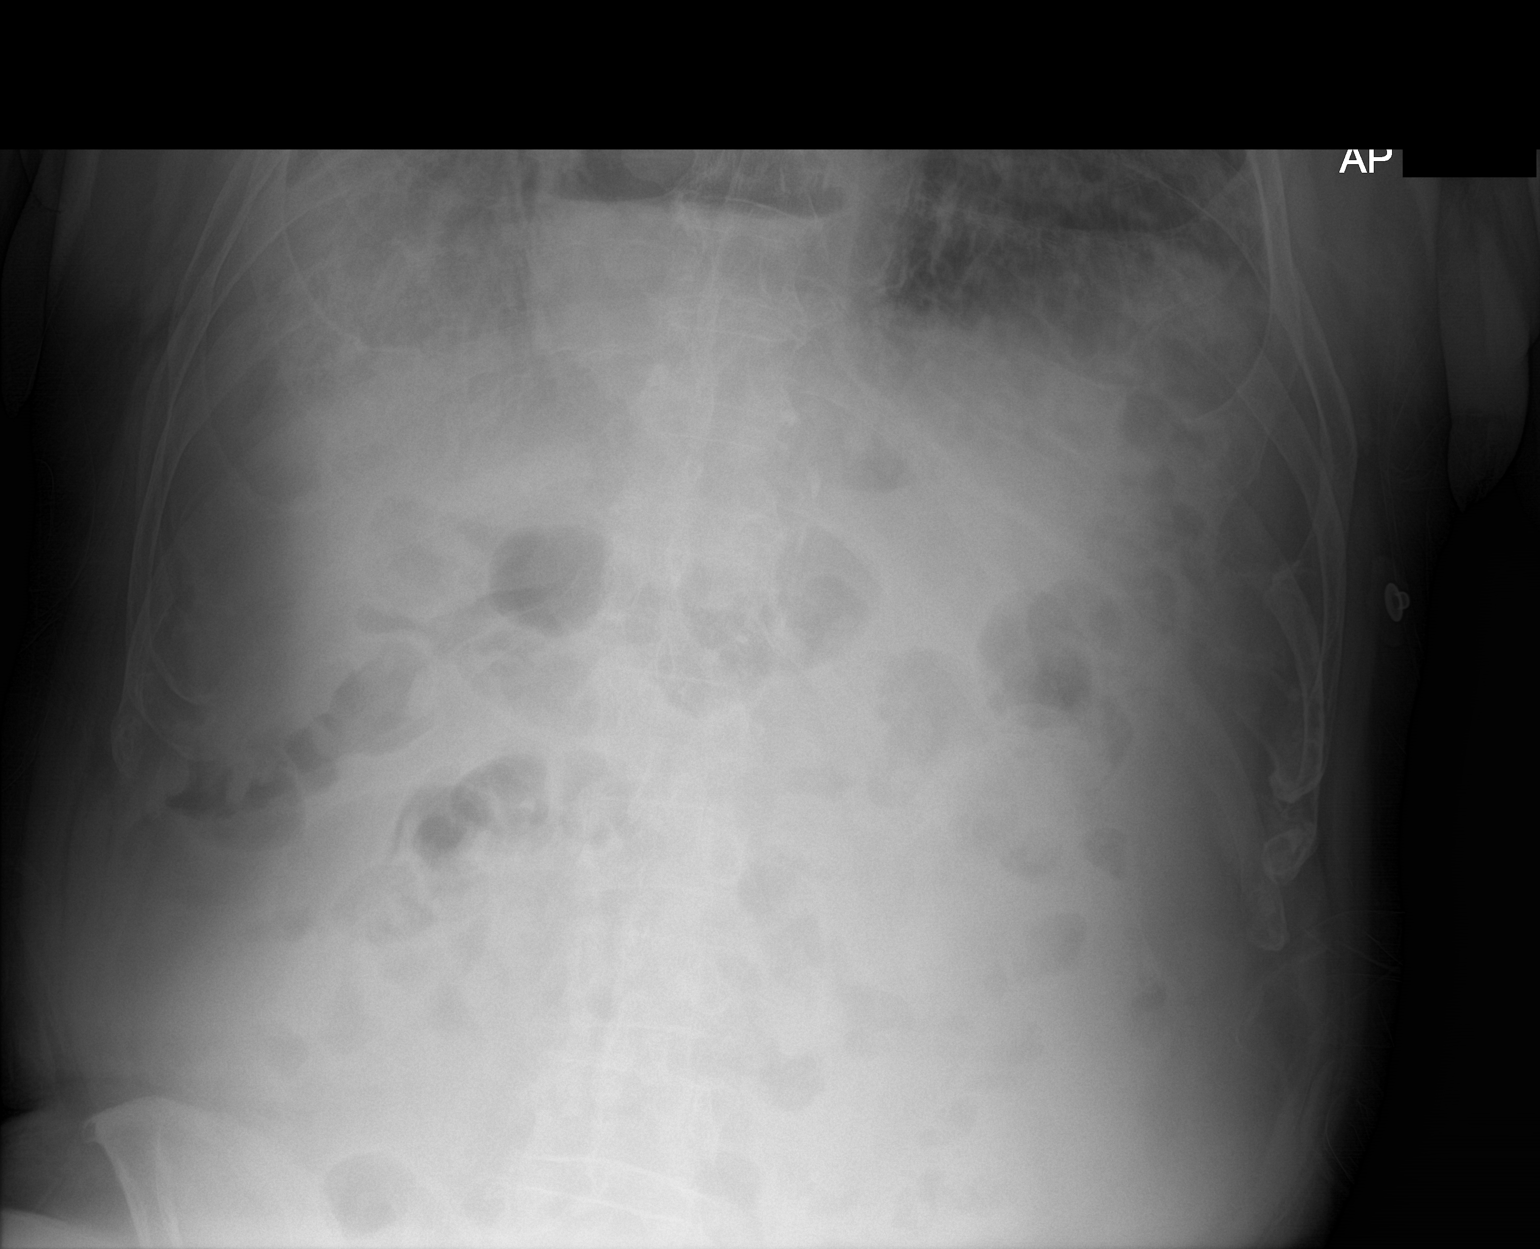

[abdomen supine]
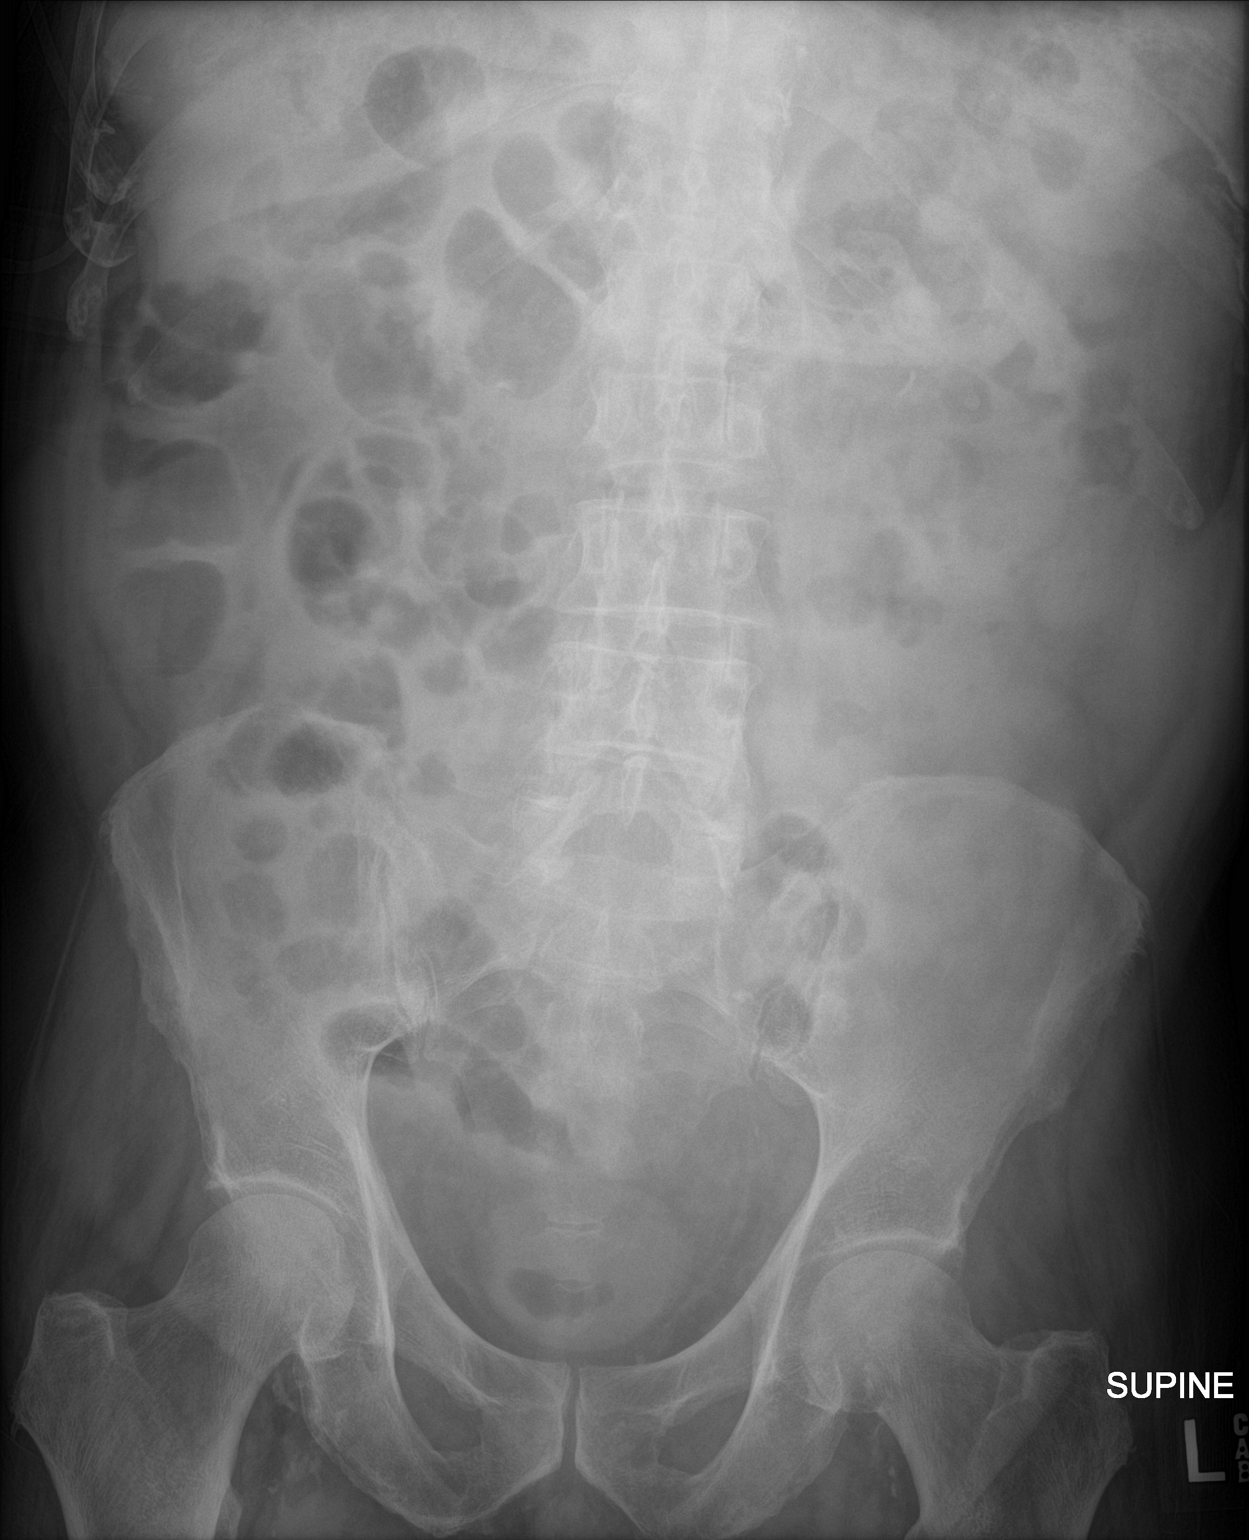

[2 of 2 positions shown; findings below may reference images not displayed]

FINDINGS: Medium-sized hiatal hernia. No free intraperitoneal air. No dilated
small bowel. No abnormal calcification or visible mass.
IMPRESSION: 1. Medium-sized hiatal hernia.
2. No radiographic evidence of small-bowel obstruction.

## 2019-07-14 IMAGING — US US EXTREM LOW VENOUS*L*
1 series · 13 of 24 positions shown · non-contrast
Comparison: None.

CLINICAL DATA: Left lower extremity pain and edema. History of lung
cancer. Evaluate for DVT.



[Series 1: us extrem low venous*left* · 13 of 33 slices shown]
[im 1/33]
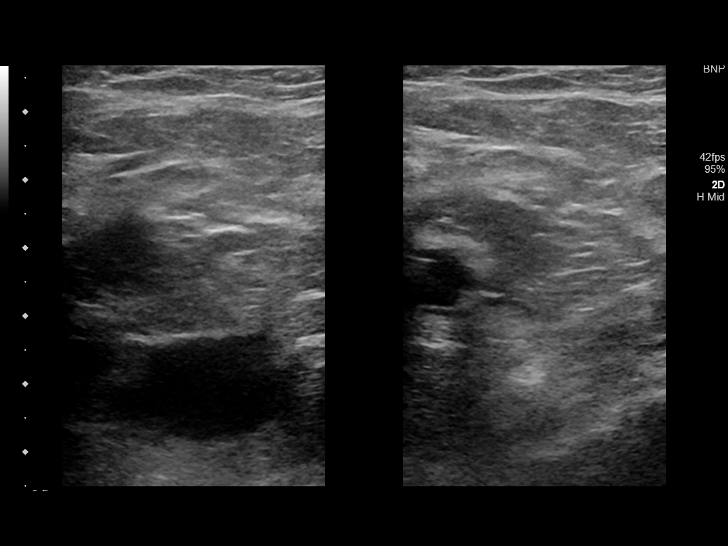
[im 3/33]
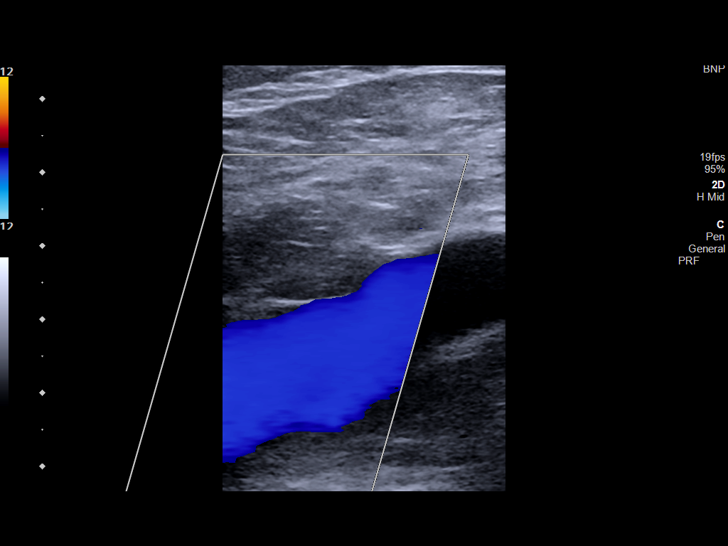
[im 6/33]
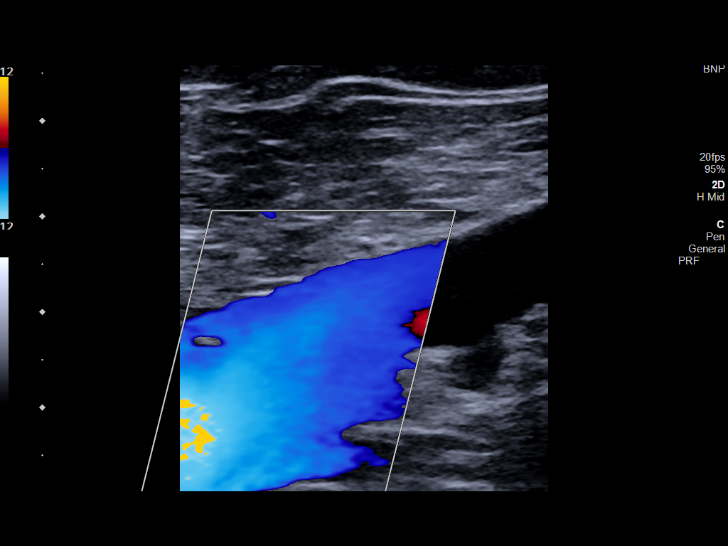
[im 9/33]
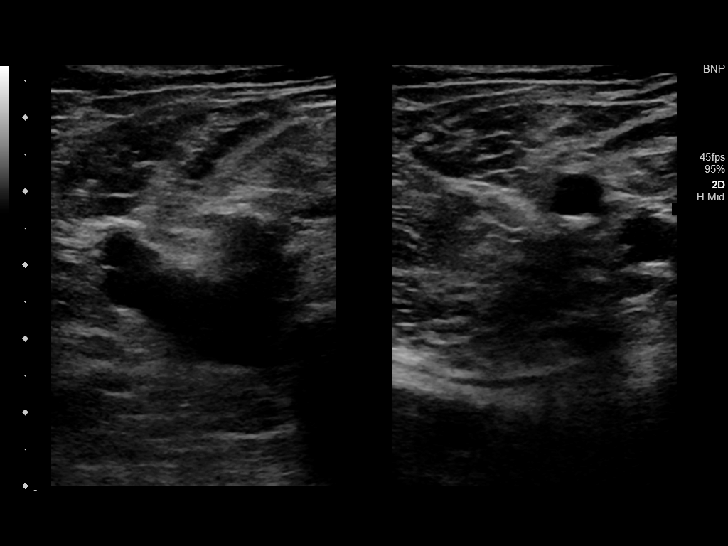
[im 12/33]
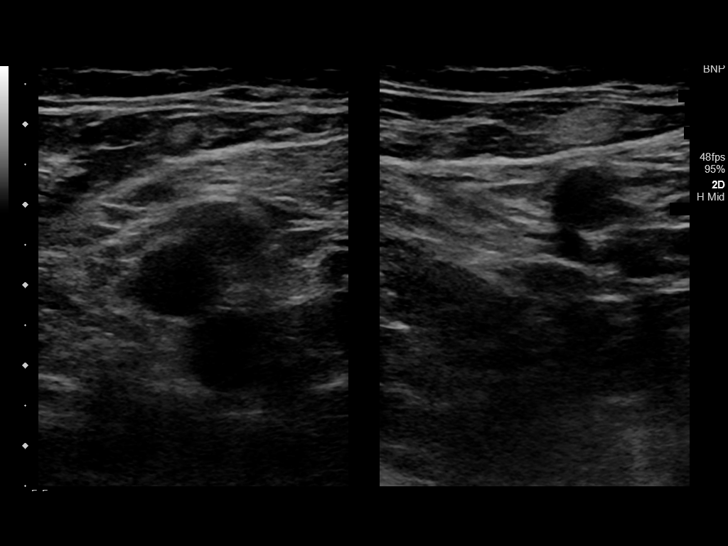
[im 14/33]
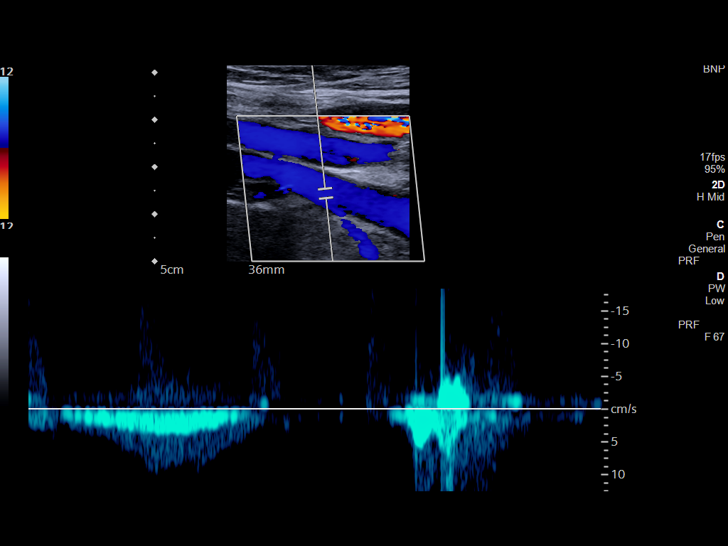
[im 17/33]
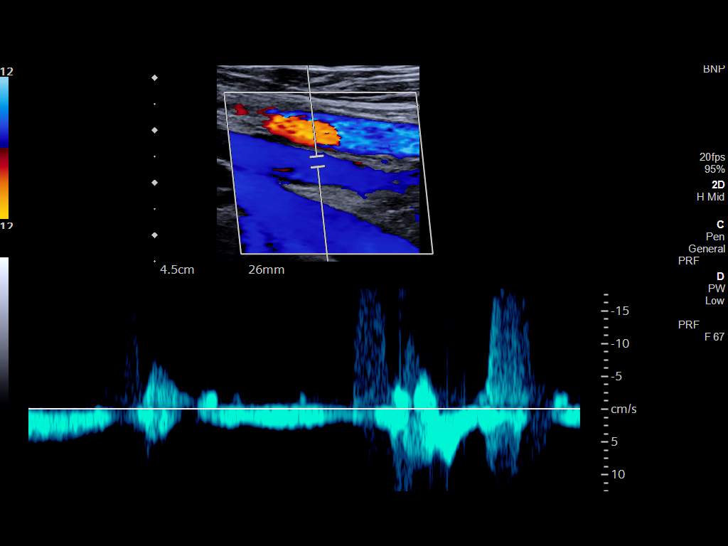
[im 19/33]
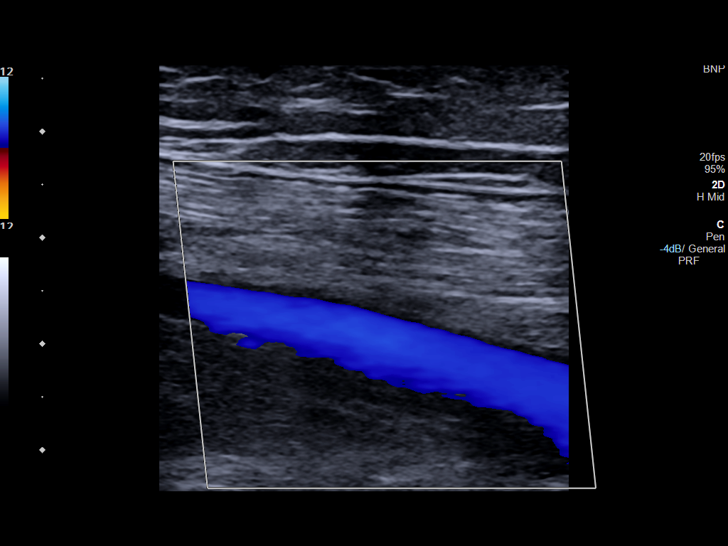
[im 21/33]
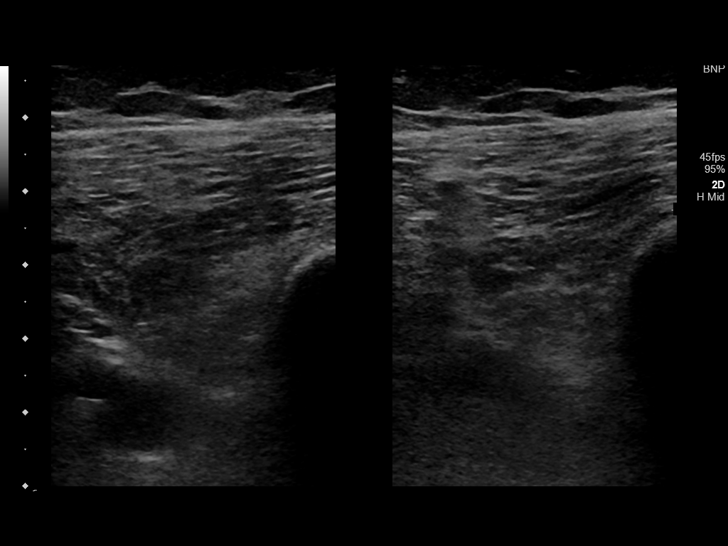
[im 24/33]
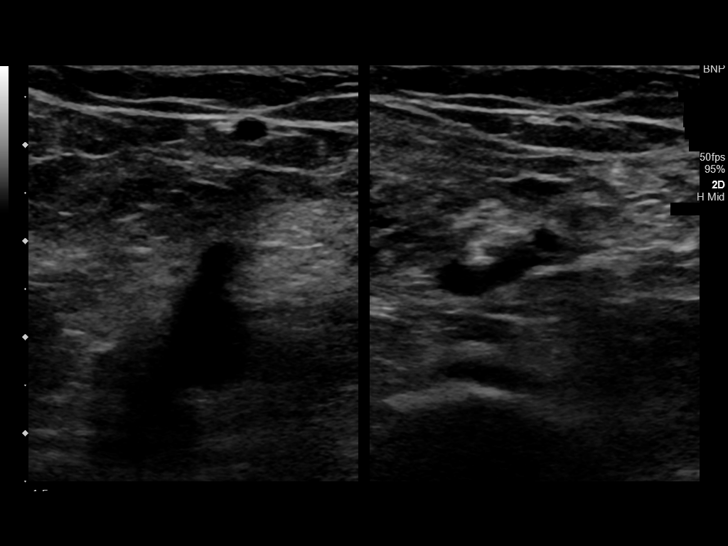
[im 27/33]
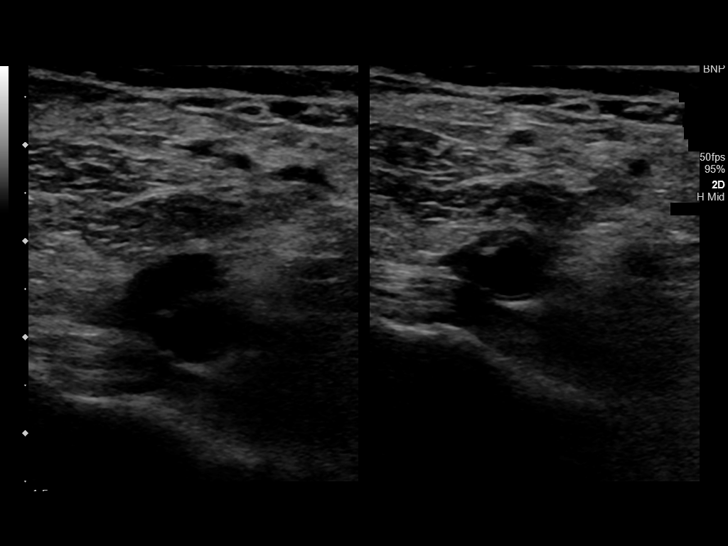
[im 30/33]
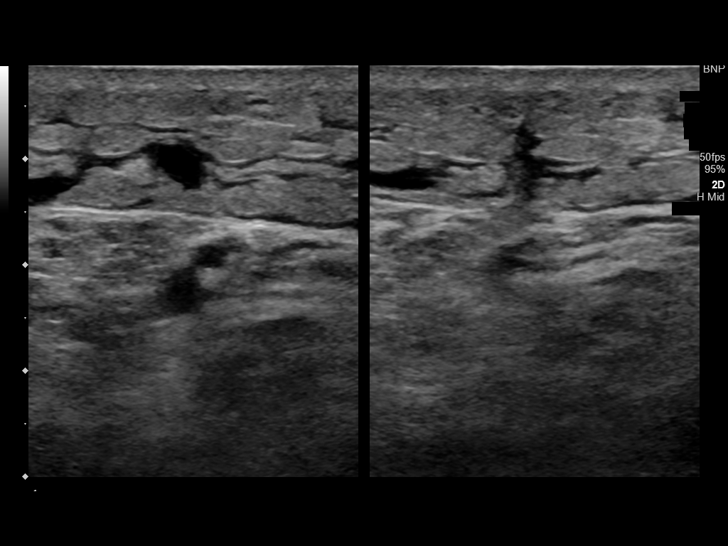
[im 33/33]
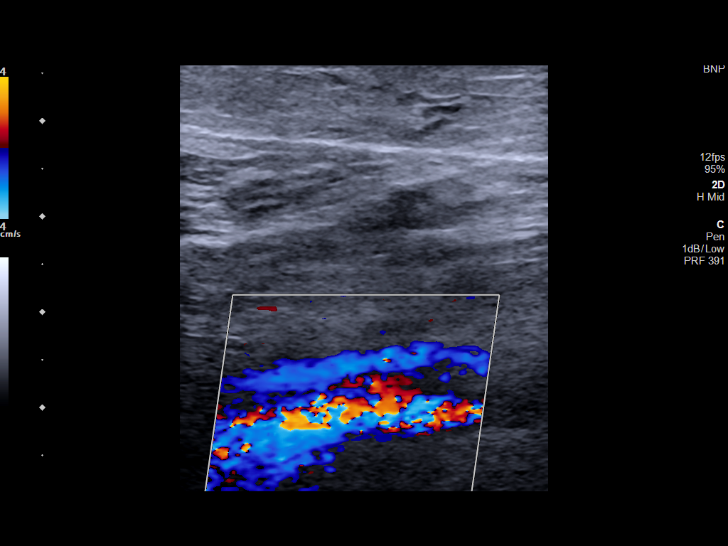

[13 of 24 positions shown; findings below may reference images not displayed]

FINDINGS: Contralateral Common Femoral Vein: Respiratory phasicity is normal
and symmetric with the symptomatic side. No evidence of thrombus.
Normal compressibility.

Common Femoral Vein: No evidence of thrombus. Normal
compressibility, respiratory phasicity and response to augmentation.

Saphenofemoral Junction: No evidence of thrombus. Normal
compressibility and flow on color Doppler imaging.

Profunda Femoral Vein: No evidence of thrombus. Normal
compressibility and flow on color Doppler imaging.

Femoral Vein: No evidence of thrombus. Normal compressibility,
respiratory phasicity and response to augmentation.

Popliteal Vein: No evidence of thrombus. Normal compressibility,
respiratory phasicity and response to augmentation.

Calf Veins: No evidence of thrombus. Normal compressibility and flow
on color Doppler imaging.

Superficial Great Saphenous Vein: No evidence of thrombus. Normal
compressibility.

Venous Reflux:  None.

Other Findings: There is a minimal subcutaneous edema at the level
the left lower leg and calf.
IMPRESSION: No evidence of DVT within the left lower extremity.

## 2019-08-26 IMAGING — DX DG CHEST 1V PORT
1 series · 1 of 1 positions shown · non-contrast
Comparison: CT 05/11/2018.

CLINICAL DATA: Shortness of breath.  Weakness.

EXAM:
PORTABLE CHEST 1 VIEW

[chest ap]
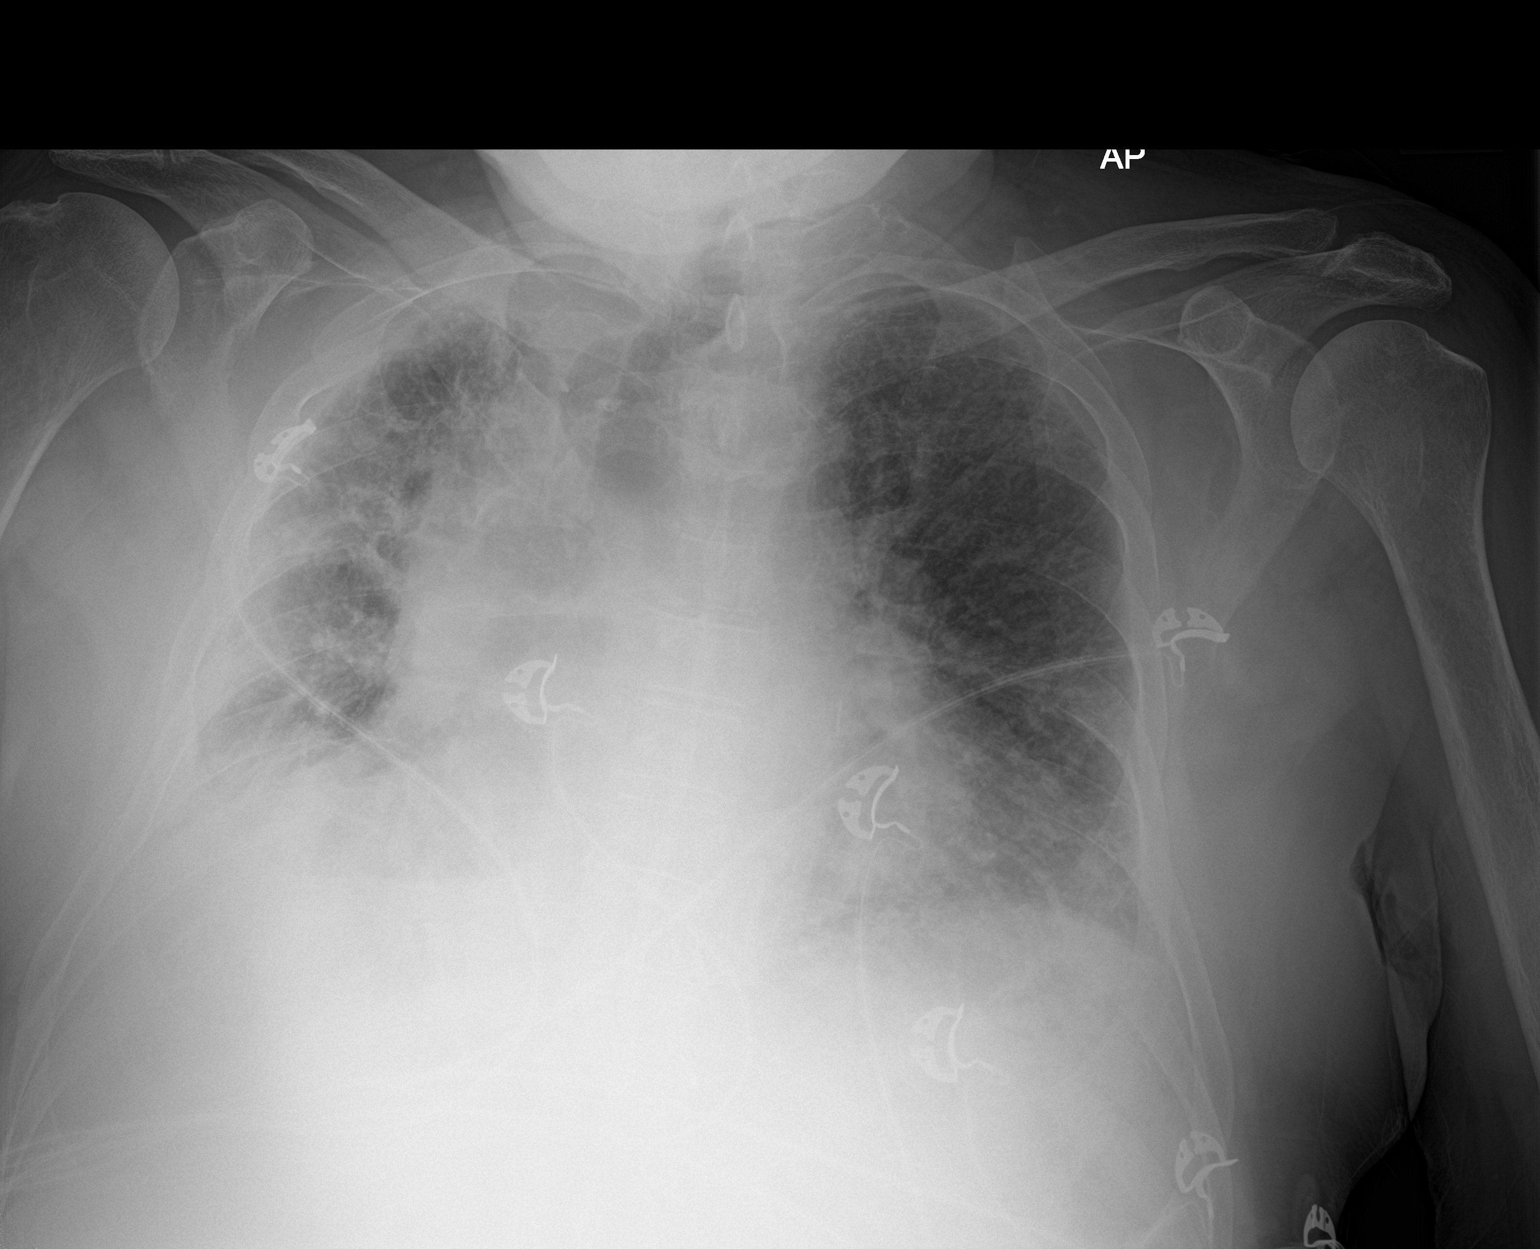

[1 of 1 positions shown; findings below may reference images not displayed]

FINDINGS: Cardiomegaly. Chronic interstitial changes are noted. Active
interstitial process cannot be excluded. No pleural effusion or
pneumothorax. Biapical pleural thickening consistent scarring. No
acute bony abnormality.
IMPRESSION: Cardiomegaly. Bilateral interstitial prominence noted. These
findings are consistent with chronic interstitial lung disease.
Active interstitial process including interstitial edema and/or
pneumonitis cannot be completely excluded.

## 2020-01-09 ENCOUNTER — Ambulatory Visit: Payer: Medicare Other | Admitting: Urology
